# Patient Record
Sex: Female | Born: 1935
Health system: Southern US, Community
[De-identification: ages and names within clinical notes are randomized; demographics above are authoritative.]

## PROBLEM LIST (undated history)

## (undated) DIAGNOSIS — I10 Essential (primary) hypertension: Secondary | ICD-10-CM

## (undated) DIAGNOSIS — F419 Anxiety disorder, unspecified: Secondary | ICD-10-CM

## (undated) HISTORY — PX: ABDOMINAL HYSTERECTOMY: SHX81

## (undated) HISTORY — PX: APPENDECTOMY: SHX54

## (undated) HISTORY — PX: DILATION AND CURETTAGE OF UTERUS: SHX78

---

## 1998-04-02 ENCOUNTER — Other Ambulatory Visit: Admission: RE | Admit: 1998-04-02 | Discharge: 1998-04-02 | Payer: Self-pay | Admitting: Gynecology

## 1999-11-17 ENCOUNTER — Other Ambulatory Visit: Admission: RE | Admit: 1999-11-17 | Discharge: 1999-11-17 | Payer: Self-pay | Admitting: *Deleted

## 2000-08-25 ENCOUNTER — Other Ambulatory Visit: Admission: RE | Admit: 2000-08-25 | Discharge: 2000-08-25 | Payer: Self-pay | Admitting: *Deleted

## 2000-08-25 ENCOUNTER — Encounter (INDEPENDENT_AMBULATORY_CARE_PROVIDER_SITE_OTHER): Payer: Self-pay

## 2001-07-20 ENCOUNTER — Other Ambulatory Visit: Admission: RE | Admit: 2001-07-20 | Discharge: 2001-07-20 | Payer: Self-pay | Admitting: Obstetrics and Gynecology

## 2002-04-08 ENCOUNTER — Other Ambulatory Visit: Admission: RE | Admit: 2002-04-08 | Discharge: 2002-04-08 | Payer: Self-pay | Admitting: Obstetrics and Gynecology

## 2002-04-23 ENCOUNTER — Encounter: Payer: Self-pay | Admitting: Anesthesiology

## 2002-04-25 ENCOUNTER — Inpatient Hospital Stay (HOSPITAL_COMMUNITY): Admission: RE | Admit: 2002-04-25 | Discharge: 2002-04-28 | Payer: Self-pay | Admitting: Obstetrics and Gynecology

## 2003-07-17 ENCOUNTER — Ambulatory Visit (HOSPITAL_COMMUNITY): Admission: RE | Admit: 2003-07-17 | Discharge: 2003-07-17 | Payer: Self-pay | Admitting: Family Medicine

## 2007-05-25 ENCOUNTER — Ambulatory Visit: Payer: Self-pay | Admitting: Internal Medicine

## 2007-06-06 ENCOUNTER — Telehealth (INDEPENDENT_AMBULATORY_CARE_PROVIDER_SITE_OTHER): Payer: Self-pay | Admitting: *Deleted

## 2008-10-13 DIAGNOSIS — I10 Essential (primary) hypertension: Secondary | ICD-10-CM | POA: Insufficient documentation

## 2008-10-13 DIAGNOSIS — E785 Hyperlipidemia, unspecified: Secondary | ICD-10-CM

## 2008-10-14 ENCOUNTER — Ambulatory Visit: Payer: Self-pay | Admitting: Internal Medicine

## 2008-10-14 DIAGNOSIS — I8000 Phlebitis and thrombophlebitis of superficial vessels of unspecified lower extremity: Secondary | ICD-10-CM

## 2008-10-14 DIAGNOSIS — J40 Bronchitis, not specified as acute or chronic: Secondary | ICD-10-CM

## 2008-10-14 LAB — CONVERTED CEMR LAB
Basophils Absolute: 0 10*3/uL (ref 0.0–0.1)
Basophils Relative: 0 % (ref 0.0–3.0)
Eosinophils Absolute: 0.3 10*3/uL (ref 0.0–0.7)
Eosinophils Relative: 4.2 % (ref 0.0–5.0)
HCT: 39.5 % (ref 36.0–46.0)
Hemoglobin: 13.4 g/dL (ref 12.0–15.0)
IgE (Immunoglobulin E), Serum: 140.8 intl units/mL (ref 0.0–180.0)
Lymphocytes Relative: 31.1 % (ref 12.0–46.0)
Lymphs Abs: 2.1 10*3/uL (ref 0.7–4.0)
MCHC: 33.8 g/dL (ref 30.0–36.0)
MCV: 89 fL (ref 78.0–100.0)
Monocytes Absolute: 0.4 10*3/uL (ref 0.1–1.0)
Monocytes Relative: 5.5 % (ref 3.0–12.0)
Neutro Abs: 3.9 10*3/uL (ref 1.4–7.7)
Neutrophils Relative %: 59.2 % (ref 43.0–77.0)
Platelets: 272 10*3/uL (ref 150.0–400.0)
RBC: 4.44 M/uL (ref 3.87–5.11)
RDW: 12.2 % (ref 11.5–14.6)
WBC: 6.7 10*3/uL (ref 4.5–10.5)

## 2008-12-19 ENCOUNTER — Ambulatory Visit: Payer: Self-pay | Admitting: Internal Medicine

## 2008-12-22 ENCOUNTER — Telehealth: Payer: Self-pay | Admitting: Internal Medicine

## 2009-03-20 ENCOUNTER — Ambulatory Visit: Payer: Self-pay | Admitting: Internal Medicine
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
## 2010-11-26 NOTE — Op Note (Signed)
Sandra Reid, Sandra Reid                          ACCOUNT NO.:  [id number]   MEDICAL RECORD NO.:  [id number]                   PATIENT TYPE:   LOCATION:                                       FACILITY:  [facility]   PHYSICIAN:  Tilda Burrow, M.D.              DATE OF BIRTH:  [birth year]   DATE OF PROCEDURE:  DATE OF DISCHARGE:                                 OPERATIVE REPORT   PREOPERATIVE DIAGNOSIS:  Endometrial polyp, postmenopausal adnexal cyst.   POSTOPERATIVE DIAGNOSIS:  Endometrial polyp, postmenopausal adnexal cyst  (left broad ligament cyst).   PROCEDURE:  Total abdominal hysterectomy, bilateral salpingo-oophorectomy.   SURGEON:  Tilda Burrow, M.D.   ASSISTANTAmie Critchley   ANESTHESIA:  General   COMPLICATIONS:  None.   ESTIMATED BLOOD LOSS:  100 cc   INDICATIONS:  This 77 year old female has been followed through our office  for a postmenopausal adnexal cyst.  She was found, on most recent  ultrasound, to have an endometrial polyp as well.  Benign endometrial biopsy  was performed in the office.  The options discussed and the patient selected  proceeding with hysterectomy for definitive answer for all these persisting  GYN questions.   DESCRIPTION OF PROCEDURE:  The patient was taken to the operating room and  prepped and draped for a combined abdominal and vaginal procedure with Foley  catheter in place and abdomen prepped and draped.  A vertical lower  abdominal incision was performed, approximately 12-cm in length.  Easy entry  into the peritoneal cavity was encountered.  The bowel was packed away using  moist and laparotomy tapes, then 4 x 4, then the procedure continued with  elevation of the uterus with Lahey thyroid tenaculum with takedown of the  round ligaments bilaterally using 2 interrupted #0 sutures and transection  with the Bovie between the sutures.  There were some adhesions in the left  adnexa which required sharp dissection of the lateral aspects  of the sigmoid  mesentery from the tube.  There was a simple cyst buried in the left broad  ligament which was clear in appearance, completely covered with peritoneal  surfaces on both sides, and benign in characteristics.  There was no  suspicion of a tumor or malignancy.   The infundibulopelvic ligament could be isolated on the patient's left side  with some difficulty requiring sharp dissection lateral to the IP ligaments  up to its level of the internal iliac artery whereupon we could identify the  ureter deep on the lateral pelvic sidewall.  The IFP ligament could be  identified separately, cross clamped and transected.  We were able to  dissect the remaining attachments from the ovary to the sigmoid at this  time.   We then were able to elevate the broad ligament and cross clamp the uterine  vessels with the curved Heaney clamp and Kelly clamp for back bleeding and  #  0 chromic suture ligature was followed.   The right side was treated with a much easier process of identifying the  infundibulopelvic ligament and cross clamping it and remaining high away  from the ureter.  On this side, the uterine vessels were easily cross  clamped, transected and suture ligated.  The bladder flap had been developed  on the anterior uterus.  We proceeded down the upper and lower Cardinal  ligaments at this time with serial bites with straight Heaney clamps, knife  dissection, and #0 chromic suture ligature.  The patient then proceeded to  allow dissection beneath the bladder flap sufficiently that we could then  cross clamp at the vaginal cuff angles with curved Heaney clamps, enter the  cuff, and amputate the cervix off the vaginal cuff with a good smooth  surgical edge.  Interrupted sutures, figure-of-eight #0 chromic completed  closure of the cuff.  Irrigation confirmed hemostasis.  Peritoneum did not  require any suturing to have adequate reapproximation.   The pelvis was irrigated copiously,  dried, inspected, and found hemostatic.  Then the anterior peritoneum closed with #0 chromic, the fascia closed with  #0 Vicryl and the subcu fatty tissue interrupted with continuous running 2-0  plain followed by staple closure of the skin.  The patient tolerated the  procedure well, went to the recovery room in good condition.   ESTIMATED BLOOD LOSS:  100 cc                                               Tilda Burrow, M.D.    JVF/MEDQ  D:  04/25/2002  T:  04/25/2002  Job:  [id number]   cc:   [facility]
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
## 2010-11-26 NOTE — H&P (Signed)
Sandra Reid, KLEPACKI                          ACCOUNT NO.:  [id number]   MEDICAL RECORD NO.:  [id number]                   PATIENT TYPE:   LOCATION:                                       FACILITY:  [facility]   PHYSICIAN:  Tilda Burrow, M.D.              DATE OF BIRTH:  30-Jan-1931   DATE OF ADMISSION:  04/25/2002  DATE OF DISCHARGE:                                HISTORY & PHYSICAL   ADMITTING DIAGNOSES:  1. Postmenopausal endometrial polyp.  2. Postmenopausal adnexal cyst, stable.   HISTORY OF PRESENT ILLNESS:  This 77 year old female is admitted for  abdominal hysterectomy and bilateral salpingo-oophorectomy for resolution of  two specific problems.  Firstly, the patient has been followed through our  office for three years for a left adnexal cyst.  Our first review was after  an ultrasound which showed a 2.3-cm left adnexal cyst felt to represent a  simple cyst.  It could represent either ovarian or para-ovarian origin.  CA125 was within normal limits.  She had been followed for a period of a  year at another office before coming to [place].  Since coming to our office,  CA125 levels have been acceptable and within normal limits; that level was  8.0 on a scale of 0 to 30 in January of this year.  When followed up in mid-  year, the first vaginal ultrasound in our office confirmed again that this  cyst was approximately 2.9 to 3.1 cm in size and appeared to be a clear  simple cystic structure without internal solid components, excrescences or  multiple septations.  The uterus unfortunately showed the endometrium to  have a polypoid structure which on endometrial biopsy was benign.  Hysteroscopy and D&C at a minimum were indicated but after discussion of the  options, the patient wishes no longer to have to deal with the concern and  the anxiety of serial monitoring of the postmenopausal adnexal cyst.  It is  her belief that given the fact that the surgical procedure arranged for is  demanded, that a hysterectomy with removal of both tubes and ovaries is most  effective way to manage her GYN concerns; this makes excellent sense and is  fully supported.  The patient is aware that surgical risks such as injury to  adjacent organs cannot be avoided with full surgery of this sort.  Additionally, the patient knows that there is a slight possibility that the  adnexal tumor could be found to have a malignant or borderline component.  Further resection is planned to guide subsequent therapy.   PAST MEDICAL HISTORY:  Essential hypertension.   SURGICAL HISTORY:  D&C, tubal ligation, appendectomy in the late 1970s.   ALLERGIES:  TERRAMYCIN and PREDNISONE.   MEDICATIONS:  1. Antihypertensive and diuretic, [facility], as prescribed by Dr.     Kirk Ruths of [facility].  2. Xanax one-half of a  0.5 mg tablet p.r.n. at bedtime.   PHYSICAL EXAMINATION:  GENERAL:  General exam shows a healthy, slim  Caucasian female, alert and oriented x3.  Height 5 feet 4 inches.  Weight  149.  VITAL SIGNS:  Blood pressure 160/75.  HEENT:  Pupils are equal, round and reactive.  NECK:  Neck supple.  Trachea midline.  CHEST:  Chest clear to auscultation.  ABDOMEN:  Slim with well-healed midline transverse incision in the  suprapubic area from her appendectomy and tubal.  PELVIC:  External genitalia normal.  Vaginal exam:  Normal secretion and  cervix.  Pap smear class I, January 2003.  Adnexa positive for the  ultrasound-found 3-cm left cystic structure.  Uterus anteflexed.   PLAN:  Abdominal hysterectomy and bilateral salpingo-oophorectomy with  frozen section on April 25, 2002, labs April 23, 2002.                                               Tilda Burrow, M.D.    JVF/MEDQ  D:  04/14/2002  T:  04/15/2002  Job:  [id number]

## 2010-11-26 NOTE — Procedures (Signed)
   Sandra Reid, Sandra Reid                            ACCOUNT NO.:  [id number]   MEDICAL RECORD NO.:  [id number]                    PATIENT TYPE:   LOCATION:                                       FACILITY:   PHYSICIAN:  Edward L. Juanetta Gosling, M.D.             DATE OF BIRTH:   DATE OF PROCEDURE:  04/23/2002  DATE OF DISCHARGE:                              PULMONARY FUNCTION TEST   1. Spirometry does not show any evidence of a ventilatory defect but there     is some airflow obstruction at the level of the small airways.  2. Lung volumes show no evidence of restrictive change but there is minimal     air trapping.  3. DLCO is normal.  4. Arterial blood gases show mild relative resting hypoxemia.                                                Edward L. Juanetta Gosling, M.D.    ELH/MEDQ  D:  04/23/2002  T:  04/24/2002  Job:  [id number]

## 2010-11-29 ENCOUNTER — Other Ambulatory Visit (HOSPITAL_COMMUNITY): Payer: Self-pay | Admitting: Family Medicine

## 2010-11-29 DIAGNOSIS — Z0000 Encounter for general adult medical examination without abnormal findings: Secondary | ICD-10-CM

## 2010-12-03 ENCOUNTER — Other Ambulatory Visit (HOSPITAL_COMMUNITY): Payer: Self-pay | Admitting: Family Medicine

## 2010-12-03 ENCOUNTER — Ambulatory Visit (HOSPITAL_COMMUNITY)
Admission: RE | Admit: 2010-12-03 | Discharge: 2010-12-03 | Disposition: A | Discharge status: Home / Self Care | Payer: Medicare Other | Source: Ambulatory Visit | Attending: Family Medicine | Admitting: Family Medicine

## 2010-12-03 DIAGNOSIS — R05 Cough: Secondary | ICD-10-CM | POA: Insufficient documentation

## 2010-12-03 DIAGNOSIS — R059 Cough, unspecified: Secondary | ICD-10-CM | POA: Insufficient documentation

## 2010-12-03 DIAGNOSIS — R0602 Shortness of breath: Secondary | ICD-10-CM | POA: Insufficient documentation

## 2010-12-08 ENCOUNTER — Other Ambulatory Visit (HOSPITAL_COMMUNITY): Payer: Self-pay

## 2010-12-21 ENCOUNTER — Other Ambulatory Visit (HOSPITAL_COMMUNITY): Payer: Self-pay

## 2011-09-28 DIAGNOSIS — B351 Tinea unguium: Secondary | ICD-10-CM | POA: Diagnosis not present

## 2011-10-26 ENCOUNTER — Other Ambulatory Visit (HOSPITAL_COMMUNITY): Payer: Self-pay | Admitting: Physician Assistant

## 2011-10-26 DIAGNOSIS — J45909 Unspecified asthma, uncomplicated: Secondary | ICD-10-CM | POA: Diagnosis not present

## 2011-10-26 DIAGNOSIS — I10 Essential (primary) hypertension: Secondary | ICD-10-CM | POA: Diagnosis not present

## 2011-10-26 DIAGNOSIS — F411 Generalized anxiety disorder: Secondary | ICD-10-CM | POA: Diagnosis not present

## 2011-10-26 DIAGNOSIS — Z139 Encounter for screening, unspecified: Secondary | ICD-10-CM

## 2011-10-27 ENCOUNTER — Other Ambulatory Visit (HOSPITAL_COMMUNITY): Payer: Medicare Other

## 2011-10-27 ENCOUNTER — Ambulatory Visit (HOSPITAL_COMMUNITY)
Admission: RE | Admit: 2011-10-27 | Discharge: 2011-10-27 | Disposition: A | Discharge status: Home / Self Care | Payer: Medicare Other | Source: Ambulatory Visit | Attending: Physician Assistant | Admitting: Physician Assistant

## 2011-10-27 DIAGNOSIS — Z13820 Encounter for screening for osteoporosis: Secondary | ICD-10-CM | POA: Diagnosis not present

## 2011-10-27 DIAGNOSIS — Z139 Encounter for screening, unspecified: Secondary | ICD-10-CM

## 2011-10-27 DIAGNOSIS — Z0000 Encounter for general adult medical examination without abnormal findings: Secondary | ICD-10-CM | POA: Diagnosis not present

## 2011-10-27 DIAGNOSIS — F411 Generalized anxiety disorder: Secondary | ICD-10-CM | POA: Diagnosis not present

## 2011-10-27 DIAGNOSIS — J45909 Unspecified asthma, uncomplicated: Secondary | ICD-10-CM | POA: Diagnosis not present

## 2011-10-27 DIAGNOSIS — M810 Age-related osteoporosis without current pathological fracture: Secondary | ICD-10-CM | POA: Insufficient documentation

## 2011-10-27 DIAGNOSIS — I10 Essential (primary) hypertension: Secondary | ICD-10-CM | POA: Diagnosis not present

## 2011-10-27 DIAGNOSIS — Z780 Asymptomatic menopausal state: Secondary | ICD-10-CM | POA: Diagnosis not present

## 2011-10-27 DIAGNOSIS — Z9079 Acquired absence of other genital organ(s): Secondary | ICD-10-CM | POA: Diagnosis not present

## 2011-10-28 ENCOUNTER — Ambulatory Visit (HOSPITAL_COMMUNITY): Payer: Medicare Other

## 2011-12-21 DIAGNOSIS — B351 Tinea unguium: Secondary | ICD-10-CM | POA: Diagnosis not present

## 2012-01-10 DIAGNOSIS — H52229 Regular astigmatism, unspecified eye: Secondary | ICD-10-CM | POA: Diagnosis not present

## 2012-01-10 DIAGNOSIS — H2589 Other age-related cataract: Secondary | ICD-10-CM | POA: Diagnosis not present

## 2012-01-10 DIAGNOSIS — H524 Presbyopia: Secondary | ICD-10-CM | POA: Diagnosis not present

## 2012-01-10 DIAGNOSIS — H5210 Myopia, unspecified eye: Secondary | ICD-10-CM | POA: Diagnosis not present

## 2012-02-01 DIAGNOSIS — B351 Tinea unguium: Secondary | ICD-10-CM | POA: Diagnosis not present

## 2012-03-05 DIAGNOSIS — H547 Unspecified visual loss: Secondary | ICD-10-CM | POA: Diagnosis not present

## 2012-03-05 DIAGNOSIS — H2589 Other age-related cataract: Secondary | ICD-10-CM | POA: Diagnosis not present

## 2012-03-06 ENCOUNTER — Encounter (HOSPITAL_COMMUNITY): Payer: Self-pay | Admitting: Pharmacy Technician

## 2012-03-09 ENCOUNTER — Other Ambulatory Visit: Payer: Self-pay

## 2012-03-09 ENCOUNTER — Encounter (HOSPITAL_COMMUNITY)
Admission: RE | Admit: 2012-03-09 | Discharge: 2012-03-09 | Disposition: A | Discharge status: Home / Self Care | Payer: Medicare Other | Source: Ambulatory Visit | Attending: Ophthalmology | Admitting: Ophthalmology

## 2012-03-09 ENCOUNTER — Encounter (HOSPITAL_COMMUNITY): Payer: Self-pay

## 2012-03-09 DIAGNOSIS — H2589 Other age-related cataract: Secondary | ICD-10-CM | POA: Diagnosis not present

## 2012-03-09 DIAGNOSIS — Z01810 Encounter for preprocedural cardiovascular examination: Secondary | ICD-10-CM | POA: Diagnosis not present

## 2012-03-09 DIAGNOSIS — I10 Essential (primary) hypertension: Secondary | ICD-10-CM | POA: Diagnosis not present

## 2012-03-09 DIAGNOSIS — Z01812 Encounter for preprocedural laboratory examination: Secondary | ICD-10-CM | POA: Diagnosis not present

## 2012-03-09 HISTORY — DX: Anxiety disorder, unspecified: F41.9

## 2012-03-09 HISTORY — DX: Essential (primary) hypertension: I10

## 2012-03-09 LAB — BASIC METABOLIC PANEL
BUN: 10 mg/dL (ref 6–23)
Chloride: 99 mEq/L (ref 96–112)
GFR calc Af Amer: 68 mL/min — ABNORMAL LOW (ref >90)
Glucose, Bld: 101 mg/dL — ABNORMAL HIGH (ref 70–99)
Potassium: 4.3 mEq/L (ref 3.5–5.1)

## 2012-03-09 NOTE — Patient Instructions (Addendum)
Your procedure is scheduled on: 03/15/2012  Report to [facility] at   1130      AM.  Call this number if you have problems the morning of surgery: [phone]   Do not eat food or drink liquids :After Midnight.      Take these medicines the morning of surgery with A SIP OF WATER:xanax,diovan,singulair   Do not wear jewelry, make-up or nail polish.  Do not wear lotions, powders, or perfumes. You may wear deodorant.  Do not shave 48 hours prior to surgery.  Do not bring valuables to the hospital.  Contacts, dentures or bridgework may not be worn into surgery.  Leave suitcase in the car. After surgery it may be brought to your room.  For patients admitted to the hospital, checkout time is 11:00 AM the day of discharge.   Patients discharged the day of surgery will not be allowed to drive home.  :     Please read over the following fact sheets that you were given: Coughing and Deep Breathing, Surgical Site Infection Prevention, Anesthesia Post-op Instructions and Care and Recovery After Surgery    Cataract A cataract is a clouding of the lens of the eye. When a lens becomes cloudy, vision is reduced based on the degree and nature of the clouding. Many cataracts reduce vision to some degree. Some cataracts make people more near-sighted as they develop. Other cataracts increase glare. Cataracts that are ignored and become worse can sometimes look white. The white color can be seen through the pupil. CAUSES   Aging. However, cataracts may occur at any age, even in newborns.   Certain drugs.   Trauma to the eye.   Certain diseases such as diabetes.   Specific eye diseases such as chronic inflammation inside the eye or a sudden attack of a rare form of glaucoma.   Inherited or acquired medical problems.  SYMPTOMS   Gradual, progressive drop in vision in the affected eye.   Severe, rapid visual loss. This most often happens when trauma is the cause.  DIAGNOSIS  To detect a cataract, an  eye doctor examines the lens. Cataracts are best diagnosed with an exam of the eyes with the pupils enlarged (dilated) by drops.  TREATMENT  For an early cataract, vision may improve by using different eyeglasses or stronger lighting. If that does not help your vision, surgery is the only effective treatment. A cataract needs to be surgically removed when vision loss interferes with your everyday activities, such as driving, reading, or watching TV. A cataract may also have to be removed if it prevents examination or treatment of another eye problem. Surgery removes the cloudy lens and usually replaces it with a substitute lens (intraocular lens, IOL).  At a time when both you and your doctor agree, the cataract will be surgically removed. If you have cataracts in both eyes, only one is usually removed at a time. This allows the operated eye to heal and be out of danger from any possible problems after surgery (such as infection or poor wound healing). In rare cases, a cataract may be doing damage to your eye. In these cases, your caregiver may advise surgical removal right away. The vast majority of people who have cataract surgery have better vision afterward. HOME CARE INSTRUCTIONS  If you are not planning surgery, you may be asked to do the following:  Use different eyeglasses.   Use stronger or brighter lighting.   Ask your eye doctor about reducing  your medicine dose or changing medicines if it is thought that a medicine caused your cataract. Changing medicines does not make the cataract go away on its own.   Become familiar with your surroundings. Poor vision can lead to injury. Avoid bumping into things on the affected side. You are at a higher risk for tripping or falling.   Exercise extreme care when driving or operating machinery.   Wear sunglasses if you are sensitive to bright light or experiencing problems with glare.  SEEK IMMEDIATE MEDICAL CARE IF:   You have a worsening or sudden  vision loss.   You notice redness, swelling, or increasing pain in the eye.   You have a fever.  Document Released: 06/27/2005 Document Revised: 06/16/2011 Document Reviewed: 02/18/2011 [facility] Patient Information 2012 [place], [place].PATIENT INSTRUCTIONS POST-ANESTHESIA  IMMEDIATELY FOLLOWING SURGERY:  Do not drive or operate machinery for the first twenty four hours after surgery.  Do not make any important decisions for twenty four hours after surgery or while taking narcotic pain medications or sedatives.  If you develop intractable nausea and vomiting or a severe headache please notify your doctor immediately.  FOLLOW-UP:  Please make an appointment with your surgeon as instructed. You do not need to follow up with anesthesia unless specifically instructed to do so.  WOUND CARE INSTRUCTIONS (if applicable):  Keep a dry clean dressing on the anesthesia/puncture wound site if there is drainage.  Once the wound has quit draining you may leave it open to air.  Generally you should leave the bandage intact for twenty four hours unless there is drainage.  If the epidural site drains for more than 36-48 hours please call the anesthesia department.  QUESTIONS?:  Please feel free to call your physician or the hospital operator if you have any questions, and they will be happy to assist you.

## 2012-03-15 ENCOUNTER — Ambulatory Visit (HOSPITAL_COMMUNITY): Payer: Medicare Other | Admitting: Anesthesiology

## 2012-03-15 ENCOUNTER — Encounter (HOSPITAL_COMMUNITY)
Admission: RE | Disposition: A | Discharge status: Home / Self Care | Payer: Self-pay | Source: Ambulatory Visit | Attending: Ophthalmology

## 2012-03-15 ENCOUNTER — Encounter (HOSPITAL_COMMUNITY): Payer: Self-pay | Admitting: Anesthesiology

## 2012-03-15 ENCOUNTER — Encounter (HOSPITAL_COMMUNITY): Payer: Self-pay | Admitting: *Deleted

## 2012-03-15 ENCOUNTER — Ambulatory Visit (HOSPITAL_COMMUNITY)
Admission: RE | Admit: 2012-03-15 | Discharge: 2012-03-15 | Disposition: A | Discharge status: Home / Self Care | Payer: Medicare Other | Source: Ambulatory Visit | Attending: Ophthalmology | Admitting: Ophthalmology

## 2012-03-15 ENCOUNTER — Encounter (HOSPITAL_COMMUNITY): Payer: Self-pay | Admitting: Ophthalmology

## 2012-03-15 DIAGNOSIS — Z01810 Encounter for preprocedural cardiovascular examination: Secondary | ICD-10-CM | POA: Diagnosis not present

## 2012-03-15 DIAGNOSIS — H2589 Other age-related cataract: Secondary | ICD-10-CM | POA: Insufficient documentation

## 2012-03-15 DIAGNOSIS — I10 Essential (primary) hypertension: Secondary | ICD-10-CM | POA: Insufficient documentation

## 2012-03-15 DIAGNOSIS — Z01812 Encounter for preprocedural laboratory examination: Secondary | ICD-10-CM | POA: Diagnosis not present

## 2012-03-15 DIAGNOSIS — H269 Unspecified cataract: Secondary | ICD-10-CM | POA: Diagnosis not present

## 2012-03-15 HISTORY — PX: CATARACT EXTRACTION W/PHACO: SHX586

## 2012-03-15 SURGERY — PHACOEMULSIFICATION, CATARACT, WITH IOL INSERTION
Anesthesia: Monitor Anesthesia Care | Site: Eye | Laterality: Left | Wound class: Clean

## 2012-03-15 MED ORDER — PHENYLEPHRINE HCL 2.5 % OP SOLN
1.0000 [drp] | OPHTHALMIC | Status: AC
  Administered 2012-03-15 (×3): 1 [drp] via OPHTHALMIC

## 2012-03-15 MED ORDER — LIDOCAINE HCL 3.5 % OP GEL
1.0000 "application " | Freq: Once | OPHTHALMIC | Status: AC
  Administered 2012-03-15: 1 via OPHTHALMIC

## 2012-03-15 MED ORDER — LIDOCAINE 3.5 % OP GEL OPTIME - NO CHARGE
OPHTHALMIC | Status: DC | PRN
  Administered 2012-03-15: 2 [drp] via OPHTHALMIC

## 2012-03-15 MED ORDER — NEOMYCIN-POLYMYXIN-DEXAMETH 3.5-10000-0.1 OP OINT
TOPICAL_OINTMENT | OPHTHALMIC | Status: AC
  Filled 2012-03-15: qty 3.5

## 2012-03-15 MED ORDER — LIDOCAINE HCL 3.5 % OP GEL
OPHTHALMIC | Status: AC
  Filled 2012-03-15: qty 5

## 2012-03-15 MED ORDER — PROVISC 10 MG/ML IO SOLN
INTRAOCULAR | Status: DC | PRN
  Administered 2012-03-15: 8.5 mg via INTRAOCULAR

## 2012-03-15 MED ORDER — BSS IO SOLN
INTRAOCULAR | Status: DC | PRN
  Administered 2012-03-15: 15 mL via INTRAOCULAR

## 2012-03-15 MED ORDER — POVIDONE-IODINE 5 % OP SOLN
OPHTHALMIC | Status: DC | PRN
  Administered 2012-03-15: 1 via OPHTHALMIC

## 2012-03-15 MED ORDER — CYCLOPENTOLATE HCL 1 % OP SOLN
OPHTHALMIC | Status: AC
  Filled 2012-03-15: qty 2

## 2012-03-15 MED ORDER — LIDOCAINE HCL (PF) 1 % IJ SOLN
INTRAMUSCULAR | Status: AC
  Filled 2012-03-15: qty 2

## 2012-03-15 MED ORDER — NEOMYCIN-POLYMYXIN-DEXAMETH 0.1 % OP OINT
TOPICAL_OINTMENT | OPHTHALMIC | Status: DC | PRN
  Administered 2012-03-15: 1 via OPHTHALMIC

## 2012-03-15 MED ORDER — LIDOCAINE HCL (PF) 1 % IJ SOLN
INTRAMUSCULAR | Status: DC | PRN
  Administered 2012-03-15: .6 mL

## 2012-03-15 MED ORDER — TETRACAINE HCL 0.5 % OP SOLN
1.0000 [drp] | OPHTHALMIC | Status: AC
  Administered 2012-03-15 (×3): 1 [drp] via OPHTHALMIC

## 2012-03-15 MED ORDER — MIDAZOLAM HCL 2 MG/2ML IJ SOLN
1.0000 mg | INTRAMUSCULAR | Status: DC | PRN
  Administered 2012-03-15 (×2): 1 mg via INTRAVENOUS

## 2012-03-15 MED ORDER — EPINEPHRINE HCL 1 MG/ML IJ SOLN
INTRAOCULAR | Status: DC | PRN
  Administered 2012-03-15: 13:00:00

## 2012-03-15 MED ORDER — TETRACAINE HCL 0.5 % OP SOLN
OPHTHALMIC | Status: AC
  Filled 2012-03-15: qty 2

## 2012-03-15 MED ORDER — MIDAZOLAM HCL 2 MG/2ML IJ SOLN
INTRAMUSCULAR | Status: AC
  Filled 2012-03-15: qty 2

## 2012-03-15 MED ORDER — LACTATED RINGERS IV SOLN
INTRAVENOUS | Status: DC
  Administered 2012-03-15: 12:00:00 via INTRAVENOUS

## 2012-03-15 MED ORDER — CYCLOPENTOLATE HCL 1 % OP SOLN
1.0000 [drp] | OPHTHALMIC | Status: AC
  Administered 2012-03-15 (×3): 1 [drp] via OPHTHALMIC

## 2012-03-15 MED ORDER — PHENYLEPHRINE HCL 2.5 % OP SOLN
OPHTHALMIC | Status: AC
  Filled 2012-03-15: qty 2

## 2012-03-15 MED ORDER — STERILE WATER FOR IRRIGATION IR SOLN
Status: DC | PRN
  Administered 2012-03-15: 25 mL

## 2012-03-15 MED ORDER — CYCLOPENTOLATE-PHENYLEPHRINE 0.2-1 % OP SOLN: 1.0000 [drp] | OPHTHALMIC | Status: AC

## 2012-03-15 MED ORDER — EPINEPHRINE HCL 1 MG/ML IJ SOLN
INTRAMUSCULAR | Status: AC
  Filled 2012-03-15: qty 1

## 2012-03-15 SURGICAL SUPPLY — 32 items

## 2012-03-15 NOTE — H&P (Signed)
I have reviewed the H&P, the patient was re-examined, and I have identified no interval changes in medical condition and plan of care since the history and physical of record  

## 2012-03-15 NOTE — Anesthesia Postprocedure Evaluation (Signed)
  Anesthesia Post-op Note  Patient: Sandra Reid  Procedure(s) Performed: Procedure(s) (LRB) with comments: CATARACT EXTRACTION PHACO AND INTRAOCULAR LENS PLACEMENT (IOC) (Left) - CDE:  14.84  Patient Location: PACU  Anesthesia Type: MAC  Level of Consciousness: awake  Airway and Oxygen Therapy: Patient Spontanous Breathing  Post-op Pain: none  Post-op Assessment: Post-op Vital signs reviewed, Patient's Cardiovascular Status Stable, Respiratory Function Stable, Patent Airway and No signs of Nausea or vomiting  Post-op Vital Signs: Reviewed and stable  Complications: No apparent anesthesia complications

## 2012-03-15 NOTE — Transfer of Care (Signed)
Immediate Anesthesia Transfer of Care Note  Patient: Sandra Reid  Procedure(s) Performed: Procedure(s) (LRB) with comments: CATARACT EXTRACTION PHACO AND INTRAOCULAR LENS PLACEMENT (IOC) (Left) - CDE:  14.84  Patient Location: PACU and Short Stay  Anesthesia Type: MAC  Level of Consciousness: awake, alert  and oriented  Airway & Oxygen Therapy: Patient Spontanous Breathing  Post-op Assessment: Report given to PACU RN  Post vital signs: Reviewed and stable  Complications: No apparent anesthesia complications

## 2012-03-15 NOTE — Anesthesia Preprocedure Evaluation (Addendum)
Anesthesia Evaluation  Patient identified by MRN, date of birth, ID band Patient awake    Reviewed: Allergy & Precautions, H&P , NPO status , Patient's Chart, lab work & pertinent test results  Airway Mallampati: II      Dental  (+) Upper Dentures and Edentulous Lower   Pulmonary  Allergies  breath sounds clear to auscultation        Cardiovascular hypertension, Pt. on medications Rhythm:Regular     Neuro/Psych Anxiety    GI/Hepatic   Endo/Other    Renal/GU      Musculoskeletal   Abdominal   Peds  Hematology   Anesthesia Other Findings   Reproductive/Obstetrics                           Anesthesia Physical Anesthesia Plan  ASA: II  Anesthesia Plan: MAC   Post-op Pain Management:    Induction: Intravenous  Airway Management Planned: Nasal Cannula  Additional Equipment:   Intra-op Plan:   Post-operative Plan:   Informed Consent: I have reviewed the patients History and Physical, chart, labs and discussed the procedure including the risks, benefits and alternatives for the proposed anesthesia with the patient or authorized representative who has indicated his/her understanding and acceptance.     Plan Discussed with:   Anesthesia Plan Comments:         Anesthesia Quick Evaluation  

## 2012-03-15 NOTE — Brief Op Note (Signed)
Pre-Op Dx: Cataract OS Post-Op Dx: Cataract OS Surgeon: Caterina Racine Anesthesia: Topical with MAC Surgery: Cataract Extraction with Intraocular lens Implant OS Implant: B&L enVista Specimen: None Complications: None 

## 2012-03-16 NOTE — Op Note (Signed)
Sandra Reid, Sandra Reid                ACCOUNT NO.:  [id number]  MEDICAL RECORD NO.:  [id number]  LOCATION:  [facility]                          FACILITY:  [facility]  PHYSICIAN:  Susanne Greenhouse, MD       DATE OF BIRTH:  October 26, 1930  DATE OF PROCEDURE:  03/15/2012 DATE OF DISCHARGE:  03/15/2012                              OPERATIVE REPORT   PREOPERATIVE DIAGNOSIS:  Combined cataract, left eye, diagnosis code 366.19.  POSTOPERATIVE DIAGNOSIS:  Combined cataract, left eye, diagnosis code 366.19.  OPERATION PERFORMED:  Phacoemulsification with posterior chamber intraocular lens implantation, left eye.  SURGEON:  Susanne Greenhouse, MD.  ANESTHESIA:  Topical with IV sedation..  OPERATIVE SUMMARY:  In the preoperative area, dilating drops were placed into the left eye.  The patient was then brought into the operating room where she was placed under general anesthesia.  The eye was then prepped and draped.  Beginning with a 75 blade, a paracentesis port was made at the surgeon's 2 o'clock position.  The anterior chamber was then filled with a 1% nonpreserved lidocaine solution with epinephrine.  This was followed by Viscoat to deepen the chamber.  A small fornix-based peritomy was performed superiorly.  Next, a single iris hook was placed through the limbus superiorly.  A 2.4-mm keratome blade was then used to make a clear corneal incision over the iris hook.  A bent cystotome needle and Utrata forceps were used to create a continuous tear capsulotomy.  Hydrodissection was performed using balanced salt solution on a fine cannula.  The lens nucleus was then removed using phacoemulsification in a quadrant cracking technique.  The cortical material was then removed with irrigation and aspiration.  The capsular bag and anterior chamber were refilled with Provisc.  The wound was widened to approximately 3 mm and a posterior chamber intraocular lens was placed into the capsular bag without difficulty using an  [employer] lens injecting system.  A single 10-0 nylon suture was then used to close the incision as well as stromal hydration.  The Provisc was removed from the anterior chamber and capsular bag with irrigation and aspiration.  At this point, the wounds were tested for leak, which were negative.  The anterior chamber remained deep and stable.  The patient tolerated the procedure well.  There were no operative complications, and she awoke from general anesthesia without problem.  No surgical specimens.  Prosthetic device used is a [employer] enVista posterior chamber lens model MX60, power of 20.5, serial number is [id number].          ______________________________ Susanne Greenhouse, MD     KEH/MEDQ  D:  03/15/2012  T:  03/16/2012  Job:  [id number]

## 2012-03-19 ENCOUNTER — Encounter (HOSPITAL_COMMUNITY): Payer: Self-pay | Admitting: Ophthalmology

## 2012-03-21 DIAGNOSIS — D239 Other benign neoplasm of skin, unspecified: Secondary | ICD-10-CM | POA: Diagnosis not present

## 2012-05-01 DIAGNOSIS — H04129 Dry eye syndrome of unspecified lacrimal gland: Secondary | ICD-10-CM | POA: Diagnosis not present

## 2012-05-02 DIAGNOSIS — B351 Tinea unguium: Secondary | ICD-10-CM | POA: Diagnosis not present

## 2012-05-03 DIAGNOSIS — H04129 Dry eye syndrome of unspecified lacrimal gland: Secondary | ICD-10-CM | POA: Diagnosis not present

## 2012-05-10 DIAGNOSIS — H018 Other specified inflammations of eyelid: Secondary | ICD-10-CM | POA: Diagnosis not present

## 2012-05-10 DIAGNOSIS — H2589 Other age-related cataract: Secondary | ICD-10-CM | POA: Diagnosis not present

## 2012-05-11 ENCOUNTER — Encounter (HOSPITAL_COMMUNITY): Payer: Self-pay | Admitting: Pharmacy Technician

## 2012-05-11 NOTE — Patient Instructions (Signed)
20 Sandra Reid  05/11/2012   Your procedure is scheduled on: 05/17/12  Report to [facility] at 1240 AM.  Call this number if you have problems the morning of surgery: [phone]   Remember:   Do not eat food:After Midnight.  May have clear liquids:until Midnight .  Clear liquids include soda, tea, black coffee, apple or grape juice, broth.  Take these medicines the morning of surgery with A SIP OF WATER: xanax, singulair, diovan   Do not wear jewelry, make-up or nail polish.  Do not wear lotions, powders, or perfumes. You may wear deodorant.  Do not shave 48 hours prior to surgery. Men may shave face and neck.  Do not bring valuables to the hospital.  Contacts, dentures or bridgework may not be worn into surgery.  Leave suitcase in the car. After surgery it may be brought to your room.  For patients admitted to the hospital, checkout time is 11:00 AM the day of discharge.   Patients discharged the day of surgery will not be allowed to drive home.  Name and phone number of your driver: family  Special Instructions: N/A   Please read over the following fact sheets that you were given: Anesthesia Post-op Instructions and Care and Recovery After Surgery   PATIENT INSTRUCTIONS POST-ANESTHESIA  IMMEDIATELY FOLLOWING SURGERY:  Do not drive or operate machinery for the first twenty four hours after surgery.  Do not make any important decisions for twenty four hours after surgery or while taking narcotic pain medications or sedatives.  If you develop intractable nausea and vomiting or a severe headache please notify your doctor immediately.  FOLLOW-UP:  Please make an appointment with your surgeon as instructed. You do not need to follow up with anesthesia unless specifically instructed to do so.  WOUND CARE INSTRUCTIONS (if applicable):  Keep a dry clean dressing on the anesthesia/puncture wound site if there is drainage.  Once the wound has quit draining you may leave it open to air.   Generally you should leave the bandage intact for twenty four hours unless there is drainage.  If the epidural site drains for more than 36-48 hours please call the anesthesia department.  QUESTIONS?:  Please feel free to call your physician or the hospital operator if you have any questions, and they will be happy to assist you.      Cataract Surgery  A cataract is a clouding of the lens of the eye. When a lens becomes cloudy, vision is reduced based on the degree and nature of the clouding. Surgery may be needed to improve vision. Surgery removes the cloudy lens and usually replaces it with a substitute lens (intraocular lens, IOL). LET YOUR EYE DOCTOR KNOW ABOUT:  Allergies to food or medicine.  Medicines taken including herbs, eyedrops, over-the-counter medicines, and creams.  Use of steroids (by mouth or creams).  Previous problems with anesthetics or numbing medicine.  History of bleeding problems or blood clots.  Previous surgery.  Other health problems, including diabetes and kidney problems.  Possibility of pregnancy, if this applies. RISKS AND COMPLICATIONS  Infection.  Inflammation of the eyeball (endophthalmitis) that can spread to both eyes (sympathetic ophthalmia).  Poor wound healing.  If an IOL is inserted, it can later fall out of proper position. This is very uncommon.  Clouding of the part of your eye that holds an IOL in place. This is called an "after-cataract." These are uncommon, but easily treated. BEFORE THE PROCEDURE  Do not eat or  drink anything except small amounts of water for 8 to 12 before your surgery, or as directed by your caregiver.  Unless you are told otherwise, continue any eyedrops you have been prescribed.  Talk to your primary caregiver about all other medicines that you take (both prescription and non-prescription). In some cases, you may need to stop or change medicines near the time of your surgery. This is most important if you  are taking blood-thinning medicine.Do not stop medicines unless you are told to do so.  Arrange for someone to drive you to and from the procedure.  Do not put contact lenses in either eye on the day of your surgery. PROCEDURE There is more than one method for safely removing a cataract. Your doctor can explain the differences and help determine which is best for you. Phacoemulsification surgery is the most common form of cataract surgery.  An injection is given behind the eye or eyedrops are given to make this a painless procedure.  A small cut (incision) is made on the edge of the clear, dome-shaped surface that covers the front of the eye (cornea).  A tiny probe is painlessly inserted into the eye. This device gives off ultrasound waves that soften and break up the cloudy center of the lens. This makes it easier for the cloudy lens to be removed by suction.  An IOL may be implanted.  The normal lens of the eye is covered by a clear capsule. Part of that capsule is intentionally left in the eye to support the IOL.  Your surgeon may or may not use stitches to close the incision. There are other forms of cataract surgery that require a larger incision and stiches to close the eye. This approach is taken in cases where the doctor feels that the cataract cannot be easily removed using phacoemulsification. AFTER THE PROCEDURE  When an IOL is implanted, it does not need care. It becomes a permanent part of your eye and cannot be seen or felt.  Your doctor will schedule follow-up exams to check on your progress.  Review your other medicines with your doctor to see which can be resumed after surgery.  Use eyedrops or take medicine as prescribed by your doctor. Document Released: 06/16/2011 Document Revised: 09/19/2011 Document Reviewed: 06/16/2011 [facility] Patient Information 2013 [place], [place].

## 2012-05-14 ENCOUNTER — Encounter (HOSPITAL_COMMUNITY)
Admission: RE | Admit: 2012-05-14 | Discharge: 2012-05-14 | Disposition: A | Discharge status: Home / Self Care | Payer: Medicare Other | Source: Ambulatory Visit | Attending: Ophthalmology | Admitting: Ophthalmology

## 2012-05-14 ENCOUNTER — Encounter (HOSPITAL_COMMUNITY): Payer: Self-pay

## 2012-05-14 DIAGNOSIS — Z01812 Encounter for preprocedural laboratory examination: Secondary | ICD-10-CM | POA: Diagnosis not present

## 2012-05-14 DIAGNOSIS — H2589 Other age-related cataract: Secondary | ICD-10-CM | POA: Diagnosis not present

## 2012-05-14 DIAGNOSIS — I10 Essential (primary) hypertension: Secondary | ICD-10-CM | POA: Diagnosis not present

## 2012-05-14 LAB — HEMOGLOBIN AND HEMATOCRIT, BLOOD: Hemoglobin: 13.2 g/dL (ref 12.0–15.0)

## 2012-05-14 LAB — BASIC METABOLIC PANEL
BUN: 7 mg/dL (ref 6–23)
Calcium: 9.1 mg/dL (ref 8.4–10.5)
Creatinine, Ser: 0.86 mg/dL (ref 0.50–1.10)
GFR calc Af Amer: 73 mL/min — ABNORMAL LOW (ref >90)
GFR calc non Af Amer: 63 mL/min — ABNORMAL LOW (ref >90)
Potassium: 3.8 mEq/L (ref 3.5–5.1)

## 2012-05-16 MED ORDER — LIDOCAINE HCL (PF) 1 % IJ SOLN
INTRAMUSCULAR | Status: AC
  Filled 2012-05-16: qty 2

## 2012-05-16 MED ORDER — NEOMYCIN-POLYMYXIN-DEXAMETH 3.5-10000-0.1 OP OINT
TOPICAL_OINTMENT | OPHTHALMIC | Status: AC
  Filled 2012-05-16: qty 3.5

## 2012-05-16 MED ORDER — LIDOCAINE HCL 3.5 % OP GEL
OPHTHALMIC | Status: AC
  Filled 2012-05-16: qty 5

## 2012-05-16 MED ORDER — CYCLOPENTOLATE-PHENYLEPHRINE 0.2-1 % OP SOLN
OPHTHALMIC | Status: AC
  Filled 2012-05-16: qty 2

## 2012-05-16 MED ORDER — PHENYLEPHRINE HCL 2.5 % OP SOLN
OPHTHALMIC | Status: AC
  Filled 2012-05-16: qty 2

## 2012-05-16 MED ORDER — TETRACAINE HCL 0.5 % OP SOLN
OPHTHALMIC | Status: AC
  Filled 2012-05-16: qty 2

## 2012-05-17 ENCOUNTER — Ambulatory Visit (HOSPITAL_COMMUNITY)
Admission: RE | Admit: 2012-05-17 | Discharge: 2012-05-17 | Disposition: A | Discharge status: Home / Self Care | Payer: Medicare Other | Source: Ambulatory Visit | Attending: Ophthalmology | Admitting: Ophthalmology

## 2012-05-17 ENCOUNTER — Encounter (HOSPITAL_COMMUNITY): Payer: Self-pay | Admitting: *Deleted

## 2012-05-17 ENCOUNTER — Encounter (HOSPITAL_COMMUNITY): Payer: Self-pay | Admitting: Anesthesiology

## 2012-05-17 ENCOUNTER — Encounter (HOSPITAL_COMMUNITY)
Admission: RE | Disposition: A | Discharge status: Home / Self Care | Payer: Self-pay | Source: Ambulatory Visit | Attending: Ophthalmology

## 2012-05-17 ENCOUNTER — Ambulatory Visit (HOSPITAL_COMMUNITY): Payer: Medicare Other | Admitting: Anesthesiology

## 2012-05-17 DIAGNOSIS — Z01812 Encounter for preprocedural laboratory examination: Secondary | ICD-10-CM | POA: Insufficient documentation

## 2012-05-17 DIAGNOSIS — H269 Unspecified cataract: Secondary | ICD-10-CM | POA: Diagnosis not present

## 2012-05-17 DIAGNOSIS — I10 Essential (primary) hypertension: Secondary | ICD-10-CM | POA: Insufficient documentation

## 2012-05-17 DIAGNOSIS — H2589 Other age-related cataract: Secondary | ICD-10-CM | POA: Insufficient documentation

## 2012-05-17 HISTORY — PX: CATARACT EXTRACTION W/PHACO: SHX586

## 2012-05-17 SURGERY — PHACOEMULSIFICATION, CATARACT, WITH IOL INSERTION
Anesthesia: Monitor Anesthesia Care | Site: Eye | Laterality: Right | Wound class: Clean

## 2012-05-17 MED ORDER — LIDOCAINE 3.5 % OP GEL OPTIME - NO CHARGE: OPHTHALMIC | Status: DC | PRN

## 2012-05-17 MED ORDER — LIDOCAINE HCL (PF) 1 % IJ SOLN
INTRAMUSCULAR | Status: DC | PRN
  Administered 2012-05-17: .4 mL

## 2012-05-17 MED ORDER — LIDOCAINE HCL 3.5 % OP GEL: 1.0000 "application " | Freq: Once | OPHTHALMIC | Status: DC

## 2012-05-17 MED ORDER — LACTATED RINGERS IV SOLN
INTRAVENOUS | Status: DC
  Administered 2012-05-17: 1000 mL via INTRAVENOUS

## 2012-05-17 MED ORDER — ONDANSETRON HCL 4 MG/2ML IJ SOLN: 4.0000 mg | Freq: Once | INTRAMUSCULAR | Status: DC | PRN

## 2012-05-17 MED ORDER — PHENYLEPHRINE HCL 2.5 % OP SOLN
1.0000 [drp] | OPHTHALMIC | Status: AC
  Administered 2012-05-17 (×3): 1 [drp] via OPHTHALMIC

## 2012-05-17 MED ORDER — TETRACAINE HCL 0.5 % OP SOLN
1.0000 [drp] | OPHTHALMIC | Status: AC
  Administered 2012-05-17 (×3): 1 [drp] via OPHTHALMIC

## 2012-05-17 MED ORDER — POVIDONE-IODINE 5 % OP SOLN
OPHTHALMIC | Status: DC | PRN
  Administered 2012-05-17: 1 via OPHTHALMIC

## 2012-05-17 MED ORDER — NEOMYCIN-POLYMYXIN-DEXAMETH 0.1 % OP OINT
TOPICAL_OINTMENT | OPHTHALMIC | Status: DC | PRN
  Administered 2012-05-17: 1 via OPHTHALMIC

## 2012-05-17 MED ORDER — EPINEPHRINE HCL 1 MG/ML IJ SOLN
INTRAMUSCULAR | Status: AC
  Filled 2012-05-17: qty 1

## 2012-05-17 MED ORDER — LIDOCAINE HCL (PF) 1 % IJ SOLN
INTRAOCULAR | Status: DC | PRN
  Administered 2012-05-17: 15:00:00 via OPHTHALMIC

## 2012-05-17 MED ORDER — CYCLOPENTOLATE-PHENYLEPHRINE 0.2-1 % OP SOLN
1.0000 [drp] | OPHTHALMIC | Status: AC
  Administered 2012-05-17 (×3): 1 [drp] via OPHTHALMIC

## 2012-05-17 MED ORDER — FENTANYL CITRATE 0.05 MG/ML IJ SOLN: 25.0000 ug | INTRAMUSCULAR | Status: DC | PRN

## 2012-05-17 MED ORDER — PROVISC 10 MG/ML IO SOLN
INTRAOCULAR | Status: DC | PRN
  Administered 2012-05-17: 8.5 mg via INTRAOCULAR

## 2012-05-17 MED ORDER — MIDAZOLAM HCL 2 MG/2ML IJ SOLN
INTRAMUSCULAR | Status: AC
  Filled 2012-05-17: qty 2

## 2012-05-17 MED ORDER — EPINEPHRINE HCL 1 MG/ML IJ SOLN
INTRAOCULAR | Status: DC | PRN
  Administered 2012-05-17: 15:00:00

## 2012-05-17 MED ORDER — BSS IO SOLN
INTRAOCULAR | Status: DC | PRN
  Administered 2012-05-17: 15 mL via INTRAOCULAR

## 2012-05-17 MED ORDER — MIDAZOLAM HCL 2 MG/2ML IJ SOLN
1.0000 mg | INTRAMUSCULAR | Status: DC | PRN
  Administered 2012-05-17: 2 mg via INTRAVENOUS

## 2012-05-17 SURGICAL SUPPLY — 32 items

## 2012-05-17 NOTE — Anesthesia Postprocedure Evaluation (Signed)
  Anesthesia Post-op Note  Patient: Sandra Reid  Procedure(s) Performed: Procedure(s) (LRB) with comments: CATARACT EXTRACTION PHACO AND INTRAOCULAR LENS PLACEMENT (IOC) (Right) - CDE:13.59  Patient Location: PACU and Short Stay  Anesthesia Type:MAC  Level of Consciousness: awake, alert , oriented and patient cooperative  Airway and Oxygen Therapy: Patient Spontanous Breathing  Post-op Pain: none  Post-op Assessment: Post-op Vital signs reviewed, Patient's Cardiovascular Status Stable, Respiratory Function Stable, Patent Airway, No signs of Nausea or vomiting, Adequate PO intake and Pain level controlled  Post-op Vital Signs: Reviewed and stable  Complications: No apparent anesthesia complications

## 2012-05-17 NOTE — H&P (Signed)
I have reviewed the H&P, the patient was re-examined, and I have identified no interval changes in medical condition and plan of care since the history and physical of record  

## 2012-05-17 NOTE — Brief Op Note (Signed)
Pre-Op Dx: Cataract OD Post-Op Dx: Cataract OD Surgeon: Sobia Karger Anesthesia: Topical with MAC Surgery: Cataract Extraction with Intraocular lens Implant OD Implant: B&L enVista Specimen: None Complications: None 

## 2012-05-17 NOTE — Anesthesia Preprocedure Evaluation (Signed)
Anesthesia Evaluation  Patient identified by MRN, date of birth, ID band Patient awake    Reviewed: Allergy & Precautions, H&P , NPO status , Patient's Chart, lab work & pertinent test results  Airway Mallampati: II      Dental  (+) Upper Dentures and Edentulous Lower   Pulmonary  Allergies  breath sounds clear to auscultation        Cardiovascular hypertension, Pt. on medications Rhythm:Regular     Neuro/Psych Anxiety    GI/Hepatic   Endo/Other    Renal/GU      Musculoskeletal   Abdominal   Peds  Hematology   Anesthesia Other Findings   Reproductive/Obstetrics                           Anesthesia Physical Anesthesia Plan  ASA: II  Anesthesia Plan: MAC   Post-op Pain Management:    Induction: Intravenous  Airway Management Planned: Nasal Cannula  Additional Equipment:   Intra-op Plan:   Post-operative Plan:   Informed Consent: I have reviewed the patients History and Physical, chart, labs and discussed the procedure including the risks, benefits and alternatives for the proposed anesthesia with the patient or authorized representative who has indicated his/her understanding and acceptance.     Plan Discussed with:   Anesthesia Plan Comments:         Anesthesia Quick Evaluation

## 2012-05-17 NOTE — Transfer of Care (Signed)
Immediate Anesthesia Transfer of Care Note  Patient: Sandra Reid  Procedure(s) Performed: Procedure(s) (LRB) with comments: CATARACT EXTRACTION PHACO AND INTRAOCULAR LENS PLACEMENT (IOC) (Right) - CDE:13.59  Patient Location: PACU and Short Stay  Anesthesia Type:MAC  Level of Consciousness: awake, alert , oriented and patient cooperative  Airway & Oxygen Therapy: Patient Spontanous Breathing  Post-op Assessment: Report given to PACU RN and Post -op Vital signs reviewed and stable  Post vital signs: Reviewed and stable  Complications: No apparent anesthesia complications

## 2012-05-18 NOTE — Op Note (Signed)
Sandra Reid, Sandra Reid                ACCOUNT NO.:  [id number]  MEDICAL RECORD NO.:  [id number]  LOCATION:  [facility]                          FACILITY:  [facility]  PHYSICIAN:  Susanne Greenhouse, MD       DATE OF BIRTH:  04-20-1931  DATE OF PROCEDURE:  05/17/2012 DATE OF DISCHARGE:  05/17/2012                              OPERATIVE REPORT   PREOPERATIVE DIAGNOSIS:  Combined cataract, right eye, diagnosis code 366.19.  POSTOPERATIVE DIAGNOSIS:  Combined cataract, right eye, diagnosis code 366.19.  OPERATION PERFORMED:  Phacoemulsification with posterior chamber intraocular lens implantation, right eye.  SURGEON:  Bonne Dolores. Clemma Johnsen, MD  ANESTHESIA:  Topical with monitored anesthesia care and IV sedation.  OPERATIVE SUMMARY:  In the preoperative area, dilating drops were placed into the right eye.  The patient was then brought into the operating room where she was placed under general anesthesia.  The eye was then prepped and draped.  Beginning with a 75 blade, a paracentesis port was made at the surgeon's 2 o'clock position.  The anterior chamber was then filled with a 1% nonpreserved lidocaine solution with epinephrine.  This was followed by Viscoat to deepen the chamber.  A small fornix-based peritomy was performed superiorly.  Next, a single iris hook was placed through the limbus superiorly.  A 2.4-mm keratome blade was then used to make a clear corneal incision over the iris hook.  A bent cystotome needle and Utrata forceps were used to create a continuous tear capsulotomy.  Hydrodissection was performed using balanced salt solution on a fine cannula.  The lens nucleus was then removed using phacoemulsification in a quadrant cracking technique.  The cortical material was then removed with irrigation and aspiration.  The capsular bag and anterior chamber were refilled with Provisc.  The wound was widened to approximately 3 mm and a posterior chamber intraocular lens was placed into the capsular  bag without difficulty using an [employer] lens injecting system.  A single 10-0 nylon suture was then used to close the incision as well as stromal hydration.  The Provisc was removed from the anterior chamber and capsular bag with irrigation and aspiration.  At this point, the wounds were tested for leak, which were negative.  The anterior chamber remained deep and stable.  The patient tolerated the procedure well.  There were no operative complications, and she awoke from general anesthesia without problem.  No surgical specimens.  Prosthetic device used is a Theme park manager, model EnVista, model number MX60, power of 20.5, serial number is [id number].          ______________________________ Susanne Greenhouse, MD     KEH/MEDQ  D:  05/17/2012  T:  05/18/2012  Job:  [id number]

## 2012-05-21 ENCOUNTER — Encounter (HOSPITAL_COMMUNITY): Payer: Self-pay | Admitting: Ophthalmology

## 2012-07-20 DIAGNOSIS — J029 Acute pharyngitis, unspecified: Secondary | ICD-10-CM | POA: Diagnosis not present

## 2012-08-14 DIAGNOSIS — J019 Acute sinusitis, unspecified: Secondary | ICD-10-CM | POA: Diagnosis not present

## 2012-08-14 DIAGNOSIS — Z6827 Body mass index (BMI) 27.0-27.9, adult: Secondary | ICD-10-CM | POA: Diagnosis not present

## 2012-08-14 DIAGNOSIS — I10 Essential (primary) hypertension: Secondary | ICD-10-CM | POA: Diagnosis not present

## 2012-09-04 DIAGNOSIS — B351 Tinea unguium: Secondary | ICD-10-CM | POA: Diagnosis not present

## 2012-10-05 DIAGNOSIS — I10 Essential (primary) hypertension: Secondary | ICD-10-CM | POA: Diagnosis not present

## 2012-10-05 DIAGNOSIS — F411 Generalized anxiety disorder: Secondary | ICD-10-CM | POA: Diagnosis not present

## 2012-10-05 DIAGNOSIS — J45909 Unspecified asthma, uncomplicated: Secondary | ICD-10-CM | POA: Diagnosis not present

## 2012-10-05 DIAGNOSIS — Z6825 Body mass index (BMI) 25.0-25.9, adult: Secondary | ICD-10-CM | POA: Diagnosis not present

## 2012-11-09 DIAGNOSIS — Z6826 Body mass index (BMI) 26.0-26.9, adult: Secondary | ICD-10-CM | POA: Diagnosis not present

## 2012-11-09 DIAGNOSIS — E785 Hyperlipidemia, unspecified: Secondary | ICD-10-CM | POA: Diagnosis not present

## 2012-11-09 DIAGNOSIS — I10 Essential (primary) hypertension: Secondary | ICD-10-CM | POA: Diagnosis not present

## 2012-11-09 DIAGNOSIS — J019 Acute sinusitis, unspecified: Secondary | ICD-10-CM | POA: Diagnosis not present

## 2012-12-11 DIAGNOSIS — B351 Tinea unguium: Secondary | ICD-10-CM | POA: Diagnosis not present

## 2013-05-20 DIAGNOSIS — Z6825 Body mass index (BMI) 25.0-25.9, adult: Secondary | ICD-10-CM | POA: Diagnosis not present

## 2013-05-20 DIAGNOSIS — J209 Acute bronchitis, unspecified: Secondary | ICD-10-CM | POA: Diagnosis not present

## 2013-05-20 DIAGNOSIS — I10 Essential (primary) hypertension: Secondary | ICD-10-CM | POA: Diagnosis not present

## 2013-05-20 DIAGNOSIS — J0100 Acute maxillary sinusitis, unspecified: Secondary | ICD-10-CM | POA: Diagnosis not present

## 2013-08-06 DIAGNOSIS — B351 Tinea unguium: Secondary | ICD-10-CM | POA: Diagnosis not present

## 2013-08-06 DIAGNOSIS — B353 Tinea pedis: Secondary | ICD-10-CM | POA: Diagnosis not present

## 2013-09-17 DIAGNOSIS — B353 Tinea pedis: Secondary | ICD-10-CM | POA: Diagnosis not present

## 2013-12-11 DIAGNOSIS — Z6825 Body mass index (BMI) 25.0-25.9, adult: Secondary | ICD-10-CM | POA: Diagnosis not present

## 2013-12-11 DIAGNOSIS — I10 Essential (primary) hypertension: Secondary | ICD-10-CM | POA: Diagnosis not present

## 2013-12-16 DIAGNOSIS — E785 Hyperlipidemia, unspecified: Secondary | ICD-10-CM | POA: Diagnosis not present

## 2013-12-16 DIAGNOSIS — I10 Essential (primary) hypertension: Secondary | ICD-10-CM | POA: Diagnosis not present

## 2013-12-16 DIAGNOSIS — Z6826 Body mass index (BMI) 26.0-26.9, adult: Secondary | ICD-10-CM | POA: Diagnosis not present

## 2013-12-17 DIAGNOSIS — B351 Tinea unguium: Secondary | ICD-10-CM | POA: Diagnosis not present

## 2014-03-18 DIAGNOSIS — B351 Tinea unguium: Secondary | ICD-10-CM | POA: Diagnosis not present

## 2014-06-17 DIAGNOSIS — M79676 Pain in unspecified toe(s): Secondary | ICD-10-CM | POA: Diagnosis not present

## 2014-06-17 DIAGNOSIS — B351 Tinea unguium: Secondary | ICD-10-CM | POA: Diagnosis not present

## 2014-08-26 DIAGNOSIS — B351 Tinea unguium: Secondary | ICD-10-CM | POA: Diagnosis not present

## 2014-08-26 DIAGNOSIS — M79676 Pain in unspecified toe(s): Secondary | ICD-10-CM | POA: Diagnosis not present

## 2014-08-29 DIAGNOSIS — F419 Anxiety disorder, unspecified: Secondary | ICD-10-CM | POA: Diagnosis not present

## 2014-08-29 DIAGNOSIS — Z6826 Body mass index (BMI) 26.0-26.9, adult: Secondary | ICD-10-CM | POA: Diagnosis not present

## 2014-08-29 DIAGNOSIS — I10 Essential (primary) hypertension: Secondary | ICD-10-CM | POA: Diagnosis not present

## 2014-11-25 DIAGNOSIS — M79676 Pain in unspecified toe(s): Secondary | ICD-10-CM | POA: Diagnosis not present

## 2014-11-25 DIAGNOSIS — B351 Tinea unguium: Secondary | ICD-10-CM | POA: Diagnosis not present

## 2015-03-02 ENCOUNTER — Other Ambulatory Visit (HOSPITAL_COMMUNITY): Payer: Self-pay | Admitting: Family Medicine

## 2015-03-02 ENCOUNTER — Ambulatory Visit (HOSPITAL_COMMUNITY)
Admission: RE | Admit: 2015-03-02 | Discharge: 2015-03-02 | Disposition: A | Discharge status: Home / Self Care | Payer: Medicare Other | Source: Ambulatory Visit | Attending: Family Medicine | Admitting: Family Medicine

## 2015-03-02 DIAGNOSIS — E785 Hyperlipidemia, unspecified: Secondary | ICD-10-CM | POA: Diagnosis not present

## 2015-03-02 DIAGNOSIS — R0789 Other chest pain: Secondary | ICD-10-CM

## 2015-03-02 DIAGNOSIS — Z6826 Body mass index (BMI) 26.0-26.9, adult: Secondary | ICD-10-CM | POA: Diagnosis not present

## 2015-03-02 DIAGNOSIS — I10 Essential (primary) hypertension: Secondary | ICD-10-CM | POA: Diagnosis not present

## 2015-03-02 DIAGNOSIS — Z1389 Encounter for screening for other disorder: Secondary | ICD-10-CM | POA: Diagnosis not present

## 2015-03-02 DIAGNOSIS — L989 Disorder of the skin and subcutaneous tissue, unspecified: Secondary | ICD-10-CM | POA: Diagnosis not present

## 2015-03-02 DIAGNOSIS — R079 Chest pain, unspecified: Secondary | ICD-10-CM | POA: Insufficient documentation

## 2015-03-02 DIAGNOSIS — J310 Chronic rhinitis: Secondary | ICD-10-CM | POA: Diagnosis not present

## 2015-03-27 DIAGNOSIS — M79676 Pain in unspecified toe(s): Secondary | ICD-10-CM | POA: Diagnosis not present

## 2015-03-27 DIAGNOSIS — B351 Tinea unguium: Secondary | ICD-10-CM | POA: Diagnosis not present

## 2015-04-02 DIAGNOSIS — Z1283 Encounter for screening for malignant neoplasm of skin: Secondary | ICD-10-CM | POA: Diagnosis not present

## 2015-04-16 DIAGNOSIS — E663 Overweight: Secondary | ICD-10-CM | POA: Diagnosis not present

## 2015-04-16 DIAGNOSIS — Z6826 Body mass index (BMI) 26.0-26.9, adult: Secondary | ICD-10-CM | POA: Diagnosis not present

## 2015-04-16 DIAGNOSIS — F419 Anxiety disorder, unspecified: Secondary | ICD-10-CM | POA: Diagnosis not present

## 2015-04-16 DIAGNOSIS — Z1389 Encounter for screening for other disorder: Secondary | ICD-10-CM | POA: Diagnosis not present

## 2015-04-16 DIAGNOSIS — Z23 Encounter for immunization: Secondary | ICD-10-CM | POA: Diagnosis not present

## 2015-05-07 ENCOUNTER — Ambulatory Visit (INDEPENDENT_AMBULATORY_CARE_PROVIDER_SITE_OTHER): Payer: Medicare Other | Admitting: Otolaryngology

## 2015-05-07 DIAGNOSIS — J342 Deviated nasal septum: Secondary | ICD-10-CM | POA: Diagnosis not present

## 2015-06-12 DIAGNOSIS — B351 Tinea unguium: Secondary | ICD-10-CM | POA: Diagnosis not present

## 2015-06-12 DIAGNOSIS — M79676 Pain in unspecified toe(s): Secondary | ICD-10-CM | POA: Diagnosis not present

## 2015-07-02 DIAGNOSIS — J45909 Unspecified asthma, uncomplicated: Secondary | ICD-10-CM | POA: Diagnosis not present

## 2015-07-02 DIAGNOSIS — L259 Unspecified contact dermatitis, unspecified cause: Secondary | ICD-10-CM | POA: Diagnosis not present

## 2015-07-02 DIAGNOSIS — Z6826 Body mass index (BMI) 26.0-26.9, adult: Secondary | ICD-10-CM | POA: Diagnosis not present

## 2015-08-11 DIAGNOSIS — H10502 Unspecified blepharoconjunctivitis, left eye: Secondary | ICD-10-CM | POA: Diagnosis not present

## 2015-08-21 DIAGNOSIS — B351 Tinea unguium: Secondary | ICD-10-CM | POA: Diagnosis not present

## 2015-08-21 DIAGNOSIS — M79676 Pain in unspecified toe(s): Secondary | ICD-10-CM | POA: Diagnosis not present

## 2015-09-21 DIAGNOSIS — J209 Acute bronchitis, unspecified: Secondary | ICD-10-CM | POA: Diagnosis not present

## 2015-09-21 DIAGNOSIS — J0140 Acute pansinusitis, unspecified: Secondary | ICD-10-CM | POA: Diagnosis not present

## 2015-09-21 DIAGNOSIS — I10 Essential (primary) hypertension: Secondary | ICD-10-CM | POA: Diagnosis not present

## 2015-09-21 DIAGNOSIS — Z1389 Encounter for screening for other disorder: Secondary | ICD-10-CM | POA: Diagnosis not present

## 2015-09-21 DIAGNOSIS — F419 Anxiety disorder, unspecified: Secondary | ICD-10-CM | POA: Diagnosis not present

## 2015-09-21 DIAGNOSIS — E663 Overweight: Secondary | ICD-10-CM | POA: Diagnosis not present

## 2015-09-21 DIAGNOSIS — Z6826 Body mass index (BMI) 26.0-26.9, adult: Secondary | ICD-10-CM | POA: Diagnosis not present

## 2015-09-21 DIAGNOSIS — E782 Mixed hyperlipidemia: Secondary | ICD-10-CM | POA: Diagnosis not present

## 2015-09-29 ENCOUNTER — Other Ambulatory Visit: Payer: Self-pay | Admitting: Internal Medicine

## 2015-09-29 DIAGNOSIS — Z1231 Encounter for screening mammogram for malignant neoplasm of breast: Secondary | ICD-10-CM

## 2015-09-30 DIAGNOSIS — Z1389 Encounter for screening for other disorder: Secondary | ICD-10-CM | POA: Diagnosis not present

## 2015-09-30 DIAGNOSIS — I10 Essential (primary) hypertension: Secondary | ICD-10-CM | POA: Diagnosis not present

## 2015-09-30 DIAGNOSIS — J019 Acute sinusitis, unspecified: Secondary | ICD-10-CM | POA: Diagnosis not present

## 2015-09-30 DIAGNOSIS — J9801 Acute bronchospasm: Secondary | ICD-10-CM | POA: Diagnosis not present

## 2015-09-30 DIAGNOSIS — J209 Acute bronchitis, unspecified: Secondary | ICD-10-CM | POA: Diagnosis not present

## 2015-09-30 DIAGNOSIS — Z6826 Body mass index (BMI) 26.0-26.9, adult: Secondary | ICD-10-CM | POA: Diagnosis not present

## 2015-10-30 DIAGNOSIS — B351 Tinea unguium: Secondary | ICD-10-CM | POA: Diagnosis not present

## 2015-10-30 DIAGNOSIS — M79676 Pain in unspecified toe(s): Secondary | ICD-10-CM | POA: Diagnosis not present

## 2015-12-03 DIAGNOSIS — Z6826 Body mass index (BMI) 26.0-26.9, adult: Secondary | ICD-10-CM | POA: Diagnosis not present

## 2015-12-03 DIAGNOSIS — F419 Anxiety disorder, unspecified: Secondary | ICD-10-CM | POA: Diagnosis not present

## 2015-12-03 DIAGNOSIS — Z0000 Encounter for general adult medical examination without abnormal findings: Secondary | ICD-10-CM | POA: Diagnosis not present

## 2016-01-08 DIAGNOSIS — M79676 Pain in unspecified toe(s): Secondary | ICD-10-CM | POA: Diagnosis not present

## 2016-01-08 DIAGNOSIS — B351 Tinea unguium: Secondary | ICD-10-CM | POA: Diagnosis not present

## 2016-02-19 DIAGNOSIS — L309 Dermatitis, unspecified: Secondary | ICD-10-CM | POA: Diagnosis not present

## 2016-03-25 DIAGNOSIS — B351 Tinea unguium: Secondary | ICD-10-CM | POA: Diagnosis not present

## 2016-03-25 DIAGNOSIS — M79676 Pain in unspecified toe(s): Secondary | ICD-10-CM | POA: Diagnosis not present

## 2016-04-07 DIAGNOSIS — E663 Overweight: Secondary | ICD-10-CM | POA: Diagnosis not present

## 2016-04-07 DIAGNOSIS — Z1389 Encounter for screening for other disorder: Secondary | ICD-10-CM | POA: Diagnosis not present

## 2016-04-07 DIAGNOSIS — Z6826 Body mass index (BMI) 26.0-26.9, adult: Secondary | ICD-10-CM | POA: Diagnosis not present

## 2016-04-07 DIAGNOSIS — B355 Tinea imbricata: Secondary | ICD-10-CM | POA: Diagnosis not present

## 2016-06-24 DIAGNOSIS — M79676 Pain in unspecified toe(s): Secondary | ICD-10-CM | POA: Diagnosis not present

## 2016-06-24 DIAGNOSIS — B351 Tinea unguium: Secondary | ICD-10-CM | POA: Diagnosis not present

## 2016-09-02 DIAGNOSIS — B351 Tinea unguium: Secondary | ICD-10-CM | POA: Diagnosis not present

## 2016-09-02 DIAGNOSIS — M79676 Pain in unspecified toe(s): Secondary | ICD-10-CM | POA: Diagnosis not present

## 2016-11-11 DIAGNOSIS — B351 Tinea unguium: Secondary | ICD-10-CM | POA: Diagnosis not present

## 2016-11-11 DIAGNOSIS — M79675 Pain in left toe(s): Secondary | ICD-10-CM | POA: Diagnosis not present

## 2016-11-18 DIAGNOSIS — Z1389 Encounter for screening for other disorder: Secondary | ICD-10-CM | POA: Diagnosis not present

## 2016-11-18 DIAGNOSIS — F419 Anxiety disorder, unspecified: Secondary | ICD-10-CM | POA: Diagnosis not present

## 2016-11-18 DIAGNOSIS — N342 Other urethritis: Secondary | ICD-10-CM | POA: Diagnosis not present

## 2016-11-18 DIAGNOSIS — E669 Obesity, unspecified: Secondary | ICD-10-CM | POA: Diagnosis not present

## 2016-11-18 DIAGNOSIS — Z6826 Body mass index (BMI) 26.0-26.9, adult: Secondary | ICD-10-CM | POA: Diagnosis not present

## 2016-11-18 DIAGNOSIS — E663 Overweight: Secondary | ICD-10-CM | POA: Diagnosis not present

## 2016-11-18 DIAGNOSIS — E782 Mixed hyperlipidemia: Secondary | ICD-10-CM | POA: Diagnosis not present

## 2016-11-18 DIAGNOSIS — I10 Essential (primary) hypertension: Secondary | ICD-10-CM | POA: Diagnosis not present

## 2017-01-04 DIAGNOSIS — E785 Hyperlipidemia, unspecified: Secondary | ICD-10-CM | POA: Diagnosis not present

## 2017-01-04 DIAGNOSIS — E663 Overweight: Secondary | ICD-10-CM | POA: Diagnosis not present

## 2017-01-04 DIAGNOSIS — Z1389 Encounter for screening for other disorder: Secondary | ICD-10-CM | POA: Diagnosis not present

## 2017-01-04 DIAGNOSIS — F419 Anxiety disorder, unspecified: Secondary | ICD-10-CM | POA: Diagnosis not present

## 2017-01-04 DIAGNOSIS — Z6826 Body mass index (BMI) 26.0-26.9, adult: Secondary | ICD-10-CM | POA: Diagnosis not present

## 2017-01-04 DIAGNOSIS — I10 Essential (primary) hypertension: Secondary | ICD-10-CM | POA: Diagnosis not present

## 2017-01-04 DIAGNOSIS — Z0000 Encounter for general adult medical examination without abnormal findings: Secondary | ICD-10-CM | POA: Diagnosis not present

## 2017-03-14 DIAGNOSIS — Z6826 Body mass index (BMI) 26.0-26.9, adult: Secondary | ICD-10-CM | POA: Diagnosis not present

## 2017-03-14 DIAGNOSIS — E663 Overweight: Secondary | ICD-10-CM | POA: Diagnosis not present

## 2017-03-14 DIAGNOSIS — I10 Essential (primary) hypertension: Secondary | ICD-10-CM | POA: Diagnosis not present

## 2017-03-14 DIAGNOSIS — E785 Hyperlipidemia, unspecified: Secondary | ICD-10-CM | POA: Diagnosis not present

## 2017-03-14 DIAGNOSIS — F419 Anxiety disorder, unspecified: Secondary | ICD-10-CM | POA: Diagnosis not present

## 2017-03-14 DIAGNOSIS — Z1389 Encounter for screening for other disorder: Secondary | ICD-10-CM | POA: Diagnosis not present

## 2017-04-10 ENCOUNTER — Emergency Department (HOSPITAL_COMMUNITY)
Admission: EM | Admit: 2017-04-10 | Discharge: 2017-04-11 | Disposition: A | Discharge status: Home / Self Care | Payer: Medicare Other | Attending: Emergency Medicine | Admitting: Emergency Medicine

## 2017-04-10 ENCOUNTER — Encounter (HOSPITAL_COMMUNITY): Payer: Self-pay | Admitting: Emergency Medicine

## 2017-04-10 DIAGNOSIS — I10 Essential (primary) hypertension: Secondary | ICD-10-CM | POA: Diagnosis not present

## 2017-04-10 DIAGNOSIS — Z87891 Personal history of nicotine dependence: Secondary | ICD-10-CM | POA: Diagnosis not present

## 2017-04-10 DIAGNOSIS — R51 Headache: Secondary | ICD-10-CM | POA: Insufficient documentation

## 2017-04-10 DIAGNOSIS — Z79899 Other long term (current) drug therapy: Secondary | ICD-10-CM | POA: Insufficient documentation

## 2017-04-10 LAB — BASIC METABOLIC PANEL
Anion gap: 6 (ref 5–15)
BUN: 7 mg/dL (ref 6–20)
CHLORIDE: 102 mmol/L (ref 101–111)
CO2: 29 mmol/L (ref 22–32)
Calcium: 8.8 mg/dL — ABNORMAL LOW (ref 8.9–10.3)
Creatinine, Ser: 0.88 mg/dL (ref 0.44–1.00)
GFR, EST NON AFRICAN AMERICAN: 59 mL/min — AB (ref >60)
GLUCOSE: 106 mg/dL — AB (ref 65–99)
Potassium: 3.3 mmol/L — ABNORMAL LOW (ref 3.5–5.1)
SODIUM: 137 mmol/L (ref 135–145)

## 2017-04-10 LAB — CBC WITH DIFFERENTIAL/PLATELET
Basophils Absolute: 0 10*3/uL (ref 0.0–0.1)
Basophils Relative: 1 %
EOS ABS: 0.2 10*3/uL (ref 0.0–0.7)
Eosinophils Relative: 4 %
HEMATOCRIT: 39.5 % (ref 36.0–46.0)
Hemoglobin: 13.3 g/dL (ref 12.0–15.0)
LYMPHS ABS: 2.4 10*3/uL (ref 0.7–4.0)
Lymphocytes Relative: 40 %
MCH: 29.9 pg (ref 26.0–34.0)
MCHC: 33.7 g/dL (ref 30.0–36.0)
MCV: 88.8 fL (ref 78.0–100.0)
Monocytes Absolute: 0.4 10*3/uL (ref 0.1–1.0)
Monocytes Relative: 7 %
NEUTROS ABS: 2.8 10*3/uL (ref 1.7–7.7)
Neutrophils Relative %: 48 %
Platelets: 296 10*3/uL (ref 150–400)
RBC: 4.45 MIL/uL (ref 3.87–5.11)
RDW: 13 % (ref 11.5–15.5)
WBC: 5.9 10*3/uL (ref 4.0–10.5)

## 2017-04-10 MED ORDER — LOSARTAN POTASSIUM 50 MG PO TABS
100.0000 mg | ORAL_TABLET | Freq: Every evening | ORAL | Status: DC
  Administered 2017-04-10: 100 mg via ORAL
  Filled 2017-04-10 (×3): qty 2

## 2017-04-10 NOTE — ED Triage Notes (Signed)
Pt taken off valsartan 2 weeks ago by pcp  due to recall and started losartan. States high bp since with ha intermittent. Denies ha at this time. Nad. A/o.

## 2017-04-10 NOTE — ED Provider Notes (Signed)
[place] DEPT Provider Note   CSN: [id number] Arrival date & time: 04/10/17  1714     History   Chief Complaint Chief Complaint  Patient presents with  . Hypertension    HPI Sandra Reid is a 83 y.o. female.  Patient is an 83 year old female with a history of anxiety and hypertension presenting today with high blood pressure. Patient states she had been on valsartan for years and it was recalled. Approximately 2 weeks ago she had stopped the medication. She'll follow-up with her doctor and her initial blood pressure in her doctor's office was 140/80. They gave her prescription for losartan but stated she only needed to start it if her blood pressure started to elevate. Approximately a week and half ago patient checked her blood pressure and it was elevated show she started to losartan. Blood pressure on Thursday and Friday was 150/80. So she stopped checking her blood pressure. She states she normally follows a no salt diet however on Saturday she ate [employer] with fried chicken, mashed potatoes and gravy sure that it had a lot of salt. Yesterday she felt okay this morning when she woke up she had a headache on the top of her head. She states the headache continued to worsen throughout the morning and got to the point where it made her feel slightly lightheaded she denies any blurry vision, syncope, chest pain, shortness of breath or unilateral numbness, weakness, dizziness or speech difficulty. She checked her blood pressure at that time and it was elevated in the 200s. Patient did not take anything for the headache and it spontaneously resolved. She denies having a headache currently but continues to have elevated blood pressures. She has not taken her blood pressure medication today she usually takes it in the evening but didn't take it yesterday.   The history is provided by the patient.    Past Medical History:  Diagnosis Date  . Anxiety   . Hypertension     Patient Active  Problem List   Diagnosis Date Noted  . PHLEBITIS, SUPERFICIAL LEG VEINS 10/14/2008  . BRONCHITIS 10/14/2008  . HYPERLIPIDEMIA 10/13/2008  . HYPERTENSION 10/13/2008    Past Surgical History:  Procedure Laterality Date  . ABDOMINAL HYSTERECTOMY    . APPENDECTOMY    . CATARACT EXTRACTION W/PHACO  03/15/2012   Procedure: CATARACT EXTRACTION PHACO AND INTRAOCULAR LENS PLACEMENT (IOC);  Surgeon: Tonny Branch, MD;  Location: [facility];  Service: Ophthalmology;  Laterality: Left;  CDE:  14.84  . CATARACT EXTRACTION W/PHACO  05/17/2012   Procedure: CATARACT EXTRACTION PHACO AND INTRAOCULAR LENS PLACEMENT (IOC);  Surgeon: Tonny Branch, MD;  Location: [facility];  Service: Ophthalmology;  Laterality: Right;  CDE:13.59  . DILATION AND CURETTAGE OF UTERUS      OB History    No data available       Home Medications    Prior to Admission medications   Medication Sig Start Date End Date Taking? Authorizing Provider  acetaminophen (TYLENOL) 325 MG tablet Take 325 mg by mouth every 6 (six) hours as needed. Pain   Yes [provider]  ALPRAZolam (XANAX) 1 MG tablet Take 0.25-1 mg by mouth daily.   Yes [provider]  clotrimazole-betamethasone (LOTRISONE) cream Apply 1 application topically.  03/16/17  Yes [provider]  losartan (COZAAR) 100 MG tablet Take 100 mg by mouth every evening.  03/28/17  Yes [provider]  montelukast (SINGULAIR) 4 MG chewable tablet Chew 4 mg by mouth daily.  Yes [provider]    Family History History reviewed. No pertinent family history.  Social History Social History  Substance Use Topics  . Smoking status: Former Smoker    Packs/day: 1.00    Years: 7.00    Types: Cigarettes  . Smokeless tobacco: Former Systems developer    Quit date: 03/09/1953  . Alcohol use No     Allergies   Etodolac   Review of Systems Review of Systems  All other systems reviewed and are negative.    Physical Exam Updated Vital Signs BP (!)  202/72   Pulse (!) 56   Temp 97.8 F (36.6 C) (Oral)   Resp 20   Ht 5\' 3"  (1.6 m)   Wt 68 kg (150 lb)   SpO2 100%   BMI 26.57 kg/m   Physical Exam  Constitutional: She is oriented to person, place, and time. She appears well-developed and well-nourished. No distress.  HENT:  Head: Normocephalic and atraumatic.  Mouth/Throat: Oropharynx is clear and moist.  Eyes: Pupils are equal, round, and reactive to light. Conjunctivae and EOM are normal.  Neck: Normal range of motion. Neck supple.  Cardiovascular: Normal rate, regular rhythm and intact distal pulses.   No murmur heard. Pulmonary/Chest: Effort normal and breath sounds normal. No respiratory distress. She has no wheezes. She has no rales.  Abdominal: Soft. She exhibits no distension. There is no tenderness. There is no rebound and no guarding.  Musculoskeletal: Normal range of motion. She exhibits no edema or tenderness.  Neurological: She is alert and oriented to person, place, and time. She has normal strength. No cranial nerve deficit or sensory deficit. Coordination and gait normal.  Skin: Skin is warm and dry. No rash noted. No erythema.  Psychiatric: She has a normal mood and affect. Her behavior is normal.  Nursing note and vitals reviewed.    ED Treatments / Results  Labs (all labs ordered are listed, but only abnormal results are displayed) Labs Reviewed  BASIC METABOLIC PANEL - Abnormal; Notable for the following:       Result Value   Potassium 3.3 (*)    Glucose, Bld 106 (*)    Calcium 8.8 (*)    GFR calc non Af Amer 59 (*)    All other components within normal limits  CBC WITH DIFFERENTIAL/PLATELET    EKG  EKG Interpretation None       Radiology No results found.  Procedures Procedures (including critical care time)  Medications Ordered in ED Medications  losartan (COZAAR) tablet 100 mg (100 mg Oral Given 04/10/17 2302)     Initial Impression / Assessment and Plan / ED Course  I have  reviewed the triage vital signs and the nursing notes.  Pertinent labs & imaging results that were available during my care of the patient were reviewed by me and considered in my medical decision making (see chart for details).    Elderly female presenting today with hypertension. She currently is asymptomatic. She did have headache earlier which is now resolved. She did not have any neurologic deficits at that time and has none now. Low suspicion for intracranial bleed or stroke. Patient has hypertensive but could be related to salty diet in the last few days and patient has not taken her blood pressure medication currently. Patient did not check her blood pressure yesterday so unclear she was hypertensive yesterday or not. Labs showed normal renal function and no significant anemia or other causes for hypertension. Patient was given her losartan  here. Blood pressure is not worsening and patient is asymptomatic. Feel it is reasonable for patient to be discharged home. Continue her losartan and try to avoid salt. She has close follow-up with her PCP.  Final Clinical Impressions(s) / ED Diagnoses   Final diagnoses:  Hypertension, unspecified type    New Prescriptions New Prescriptions   No medications on file     Blanchie Dessert, MD 04/10/17 2340

## 2017-04-10 NOTE — Discharge Instructions (Signed)
Return immediately for change in vision, numbness or weakness on 1 side of the body, difficulty walking or talking, chest pain or shortness of breath.

## 2017-04-11 NOTE — ED Notes (Signed)
Pt alert & oriented x4, stable gait. Patient given discharge instructions, paperwork & prescription(s). Patient verbalized understanding. Pt left department w/ no further questions. Pt escorted to car by this nurse.

## 2017-04-19 DIAGNOSIS — E782 Mixed hyperlipidemia: Secondary | ICD-10-CM | POA: Diagnosis not present

## 2017-04-19 DIAGNOSIS — Z6825 Body mass index (BMI) 25.0-25.9, adult: Secondary | ICD-10-CM | POA: Diagnosis not present

## 2017-04-19 DIAGNOSIS — Z1389 Encounter for screening for other disorder: Secondary | ICD-10-CM | POA: Diagnosis not present

## 2017-04-19 DIAGNOSIS — E876 Hypokalemia: Secondary | ICD-10-CM | POA: Diagnosis not present

## 2017-04-19 DIAGNOSIS — I10 Essential (primary) hypertension: Secondary | ICD-10-CM | POA: Diagnosis not present

## 2017-04-19 DIAGNOSIS — R51 Headache: Secondary | ICD-10-CM | POA: Diagnosis not present

## 2017-04-19 DIAGNOSIS — E663 Overweight: Secondary | ICD-10-CM | POA: Diagnosis not present

## 2017-05-26 DIAGNOSIS — M79674 Pain in right toe(s): Secondary | ICD-10-CM | POA: Diagnosis not present

## 2017-05-26 DIAGNOSIS — B351 Tinea unguium: Secondary | ICD-10-CM | POA: Diagnosis not present

## 2017-05-26 DIAGNOSIS — M79675 Pain in left toe(s): Secondary | ICD-10-CM | POA: Diagnosis not present

## 2017-08-04 DIAGNOSIS — M79675 Pain in left toe(s): Secondary | ICD-10-CM | POA: Diagnosis not present

## 2017-08-04 DIAGNOSIS — B351 Tinea unguium: Secondary | ICD-10-CM | POA: Diagnosis not present

## 2017-08-04 DIAGNOSIS — M79674 Pain in right toe(s): Secondary | ICD-10-CM | POA: Diagnosis not present

## 2017-08-10 DIAGNOSIS — I10 Essential (primary) hypertension: Secondary | ICD-10-CM | POA: Diagnosis not present

## 2017-08-10 DIAGNOSIS — E782 Mixed hyperlipidemia: Secondary | ICD-10-CM | POA: Diagnosis not present

## 2017-08-10 DIAGNOSIS — F419 Anxiety disorder, unspecified: Secondary | ICD-10-CM | POA: Diagnosis not present

## 2017-08-10 DIAGNOSIS — Z6825 Body mass index (BMI) 25.0-25.9, adult: Secondary | ICD-10-CM | POA: Diagnosis not present

## 2017-08-10 DIAGNOSIS — Z1389 Encounter for screening for other disorder: Secondary | ICD-10-CM | POA: Diagnosis not present

## 2017-08-10 DIAGNOSIS — E785 Hyperlipidemia, unspecified: Secondary | ICD-10-CM | POA: Diagnosis not present

## 2017-08-10 DIAGNOSIS — R7309 Other abnormal glucose: Secondary | ICD-10-CM | POA: Diagnosis not present

## 2017-08-14 ENCOUNTER — Other Ambulatory Visit (HOSPITAL_COMMUNITY): Payer: Self-pay | Admitting: Family Medicine

## 2017-08-14 DIAGNOSIS — E2839 Other primary ovarian failure: Secondary | ICD-10-CM

## 2017-08-21 DIAGNOSIS — E782 Mixed hyperlipidemia: Secondary | ICD-10-CM | POA: Diagnosis not present

## 2017-08-21 DIAGNOSIS — Z6825 Body mass index (BMI) 25.0-25.9, adult: Secondary | ICD-10-CM | POA: Diagnosis not present

## 2017-08-21 DIAGNOSIS — R7309 Other abnormal glucose: Secondary | ICD-10-CM | POA: Diagnosis not present

## 2017-08-21 DIAGNOSIS — E663 Overweight: Secondary | ICD-10-CM | POA: Diagnosis not present

## 2017-08-21 DIAGNOSIS — I10 Essential (primary) hypertension: Secondary | ICD-10-CM | POA: Diagnosis not present

## 2017-08-21 DIAGNOSIS — J45901 Unspecified asthma with (acute) exacerbation: Secondary | ICD-10-CM | POA: Diagnosis not present

## 2017-08-21 DIAGNOSIS — J329 Chronic sinusitis, unspecified: Secondary | ICD-10-CM | POA: Diagnosis not present

## 2017-08-24 ENCOUNTER — Encounter (HOSPITAL_COMMUNITY): Payer: Self-pay

## 2017-08-24 ENCOUNTER — Inpatient Hospital Stay (HOSPITAL_COMMUNITY)
Admission: RE | Admit: 2017-08-24 | Discharge: 2017-08-24 | Disposition: A | Discharge status: Home / Self Care | Payer: PRIVATE HEALTH INSURANCE | Source: Ambulatory Visit | Attending: Family Medicine | Admitting: Family Medicine

## 2017-08-25 ENCOUNTER — Other Ambulatory Visit (HOSPITAL_COMMUNITY): Payer: Self-pay | Admitting: Internal Medicine

## 2017-08-25 ENCOUNTER — Ambulatory Visit (HOSPITAL_COMMUNITY)
Admission: RE | Admit: 2017-08-25 | Discharge: 2017-08-25 | Disposition: A | Discharge status: Home / Self Care | Payer: Medicare Other | Source: Ambulatory Visit | Attending: Internal Medicine | Admitting: Internal Medicine

## 2017-08-25 DIAGNOSIS — J9801 Acute bronchospasm: Secondary | ICD-10-CM | POA: Diagnosis not present

## 2017-08-25 DIAGNOSIS — J189 Pneumonia, unspecified organism: Secondary | ICD-10-CM | POA: Diagnosis not present

## 2017-08-25 DIAGNOSIS — R05 Cough: Secondary | ICD-10-CM

## 2017-08-25 DIAGNOSIS — E782 Mixed hyperlipidemia: Secondary | ICD-10-CM | POA: Diagnosis not present

## 2017-08-25 DIAGNOSIS — Z6824 Body mass index (BMI) 24.0-24.9, adult: Secondary | ICD-10-CM | POA: Diagnosis not present

## 2017-08-25 DIAGNOSIS — R059 Cough, unspecified: Secondary | ICD-10-CM

## 2017-10-17 DIAGNOSIS — I10 Essential (primary) hypertension: Secondary | ICD-10-CM | POA: Diagnosis not present

## 2017-10-17 DIAGNOSIS — E782 Mixed hyperlipidemia: Secondary | ICD-10-CM | POA: Diagnosis not present

## 2017-10-17 DIAGNOSIS — Z6824 Body mass index (BMI) 24.0-24.9, adult: Secondary | ICD-10-CM | POA: Diagnosis not present

## 2017-10-17 DIAGNOSIS — F419 Anxiety disorder, unspecified: Secondary | ICD-10-CM | POA: Diagnosis not present

## 2017-10-17 DIAGNOSIS — Z1389 Encounter for screening for other disorder: Secondary | ICD-10-CM | POA: Diagnosis not present

## 2017-11-15 DIAGNOSIS — Z6824 Body mass index (BMI) 24.0-24.9, adult: Secondary | ICD-10-CM | POA: Diagnosis not present

## 2017-11-15 DIAGNOSIS — I872 Venous insufficiency (chronic) (peripheral): Secondary | ICD-10-CM | POA: Diagnosis not present

## 2018-01-02 DIAGNOSIS — Z6824 Body mass index (BMI) 24.0-24.9, adult: Secondary | ICD-10-CM | POA: Diagnosis not present

## 2018-01-02 DIAGNOSIS — L0390 Cellulitis, unspecified: Secondary | ICD-10-CM | POA: Diagnosis not present

## 2018-01-04 DIAGNOSIS — Z1389 Encounter for screening for other disorder: Secondary | ICD-10-CM | POA: Diagnosis not present

## 2018-01-04 DIAGNOSIS — Z0001 Encounter for general adult medical examination with abnormal findings: Secondary | ICD-10-CM | POA: Diagnosis not present

## 2018-01-04 DIAGNOSIS — Z0000 Encounter for general adult medical examination without abnormal findings: Secondary | ICD-10-CM | POA: Diagnosis not present

## 2018-01-04 DIAGNOSIS — Z6824 Body mass index (BMI) 24.0-24.9, adult: Secondary | ICD-10-CM | POA: Diagnosis not present

## 2018-01-04 DIAGNOSIS — E782 Mixed hyperlipidemia: Secondary | ICD-10-CM | POA: Diagnosis not present

## 2018-02-09 DIAGNOSIS — B353 Tinea pedis: Secondary | ICD-10-CM | POA: Diagnosis not present

## 2018-02-09 DIAGNOSIS — M79672 Pain in left foot: Secondary | ICD-10-CM | POA: Diagnosis not present

## 2018-03-23 DIAGNOSIS — B353 Tinea pedis: Secondary | ICD-10-CM | POA: Diagnosis not present

## 2018-03-23 DIAGNOSIS — B351 Tinea unguium: Secondary | ICD-10-CM | POA: Diagnosis not present

## 2018-04-09 DIAGNOSIS — B353 Tinea pedis: Secondary | ICD-10-CM | POA: Diagnosis not present

## 2018-05-10 DIAGNOSIS — I10 Essential (primary) hypertension: Secondary | ICD-10-CM | POA: Diagnosis not present

## 2018-05-10 DIAGNOSIS — F419 Anxiety disorder, unspecified: Secondary | ICD-10-CM | POA: Diagnosis not present

## 2018-05-10 DIAGNOSIS — Z6824 Body mass index (BMI) 24.0-24.9, adult: Secondary | ICD-10-CM | POA: Diagnosis not present

## 2018-08-24 DIAGNOSIS — H16223 Keratoconjunctivitis sicca, not specified as Sjogren's, bilateral: Secondary | ICD-10-CM | POA: Diagnosis not present

## 2018-09-20 DIAGNOSIS — Z6825 Body mass index (BMI) 25.0-25.9, adult: Secondary | ICD-10-CM | POA: Diagnosis not present

## 2018-09-20 DIAGNOSIS — Z1389 Encounter for screening for other disorder: Secondary | ICD-10-CM | POA: Diagnosis not present

## 2018-09-20 DIAGNOSIS — J029 Acute pharyngitis, unspecified: Secondary | ICD-10-CM | POA: Diagnosis not present

## 2018-09-20 DIAGNOSIS — R6889 Other general symptoms and signs: Secondary | ICD-10-CM | POA: Diagnosis not present

## 2018-09-20 DIAGNOSIS — E663 Overweight: Secondary | ICD-10-CM | POA: Diagnosis not present

## 2018-12-13 DIAGNOSIS — J45909 Unspecified asthma, uncomplicated: Secondary | ICD-10-CM | POA: Diagnosis not present

## 2018-12-13 DIAGNOSIS — B351 Tinea unguium: Secondary | ICD-10-CM | POA: Diagnosis not present

## 2018-12-13 DIAGNOSIS — I10 Essential (primary) hypertension: Secondary | ICD-10-CM | POA: Diagnosis not present

## 2018-12-13 DIAGNOSIS — Z79899 Other long term (current) drug therapy: Secondary | ICD-10-CM | POA: Diagnosis not present

## 2018-12-13 DIAGNOSIS — B353 Tinea pedis: Secondary | ICD-10-CM | POA: Diagnosis not present

## 2018-12-13 DIAGNOSIS — Z6825 Body mass index (BMI) 25.0-25.9, adult: Secondary | ICD-10-CM | POA: Diagnosis not present

## 2018-12-13 DIAGNOSIS — E785 Hyperlipidemia, unspecified: Secondary | ICD-10-CM | POA: Diagnosis not present

## 2018-12-13 DIAGNOSIS — R7309 Other abnormal glucose: Secondary | ICD-10-CM | POA: Diagnosis not present

## 2018-12-13 DIAGNOSIS — T50905A Adverse effect of unspecified drugs, medicaments and biological substances, initial encounter: Secondary | ICD-10-CM | POA: Diagnosis not present

## 2018-12-13 DIAGNOSIS — E663 Overweight: Secondary | ICD-10-CM | POA: Diagnosis not present

## 2018-12-17 ENCOUNTER — Other Ambulatory Visit (HOSPITAL_COMMUNITY): Payer: Self-pay | Admitting: Internal Medicine

## 2018-12-17 DIAGNOSIS — E2839 Other primary ovarian failure: Secondary | ICD-10-CM

## 2019-02-14 DIAGNOSIS — Z6824 Body mass index (BMI) 24.0-24.9, adult: Secondary | ICD-10-CM | POA: Diagnosis not present

## 2019-02-14 DIAGNOSIS — Z0000 Encounter for general adult medical examination without abnormal findings: Secondary | ICD-10-CM | POA: Diagnosis not present

## 2019-02-14 DIAGNOSIS — R7309 Other abnormal glucose: Secondary | ICD-10-CM | POA: Diagnosis not present

## 2019-02-14 DIAGNOSIS — E119 Type 2 diabetes mellitus without complications: Secondary | ICD-10-CM | POA: Diagnosis not present

## 2019-02-14 DIAGNOSIS — Z1389 Encounter for screening for other disorder: Secondary | ICD-10-CM | POA: Diagnosis not present

## 2019-04-26 ENCOUNTER — Other Ambulatory Visit: Payer: Self-pay

## 2019-04-26 ENCOUNTER — Emergency Department (HOSPITAL_COMMUNITY)
Admission: EM | Admit: 2019-04-26 | Discharge: 2019-04-26 | Disposition: A | Discharge status: Home / Self Care | Payer: Medicare Other | Attending: Emergency Medicine | Admitting: Emergency Medicine

## 2019-04-26 ENCOUNTER — Encounter (HOSPITAL_COMMUNITY): Payer: Self-pay | Admitting: *Deleted

## 2019-04-26 ENCOUNTER — Emergency Department (HOSPITAL_COMMUNITY): Payer: Medicare Other

## 2019-04-26 DIAGNOSIS — I10 Essential (primary) hypertension: Secondary | ICD-10-CM

## 2019-04-26 DIAGNOSIS — H1033 Unspecified acute conjunctivitis, bilateral: Secondary | ICD-10-CM | POA: Diagnosis not present

## 2019-04-26 DIAGNOSIS — Z79899 Other long term (current) drug therapy: Secondary | ICD-10-CM | POA: Insufficient documentation

## 2019-04-26 DIAGNOSIS — H109 Unspecified conjunctivitis: Secondary | ICD-10-CM

## 2019-04-26 DIAGNOSIS — Z87891 Personal history of nicotine dependence: Secondary | ICD-10-CM | POA: Diagnosis not present

## 2019-04-26 DIAGNOSIS — R519 Headache, unspecified: Secondary | ICD-10-CM | POA: Diagnosis not present

## 2019-04-26 LAB — BASIC METABOLIC PANEL
Anion gap: 9 (ref 5–15)
BUN: 10 mg/dL (ref 8–23)
CO2: 27 mmol/L (ref 22–32)
Calcium: 9.3 mg/dL (ref 8.9–10.3)
Chloride: 102 mmol/L (ref 98–111)
Creatinine, Ser: 0.91 mg/dL (ref 0.44–1.00)
GFR calc Af Amer: >60 mL/min (ref >60)
GFR calc non Af Amer: 58 mL/min — ABNORMAL LOW (ref >60)
Glucose, Bld: 96 mg/dL (ref 70–99)
Potassium: 3.5 mmol/L (ref 3.5–5.1)
Sodium: 138 mmol/L (ref 135–145)

## 2019-04-26 LAB — CBC
HCT: 42.3 % (ref 36.0–46.0)
Hemoglobin: 13.6 g/dL (ref 12.0–15.0)
MCH: 30 pg (ref 26.0–34.0)
MCHC: 32.2 g/dL (ref 30.0–36.0)
MCV: 93.2 fL (ref 80.0–100.0)
Platelets: 314 10*3/uL (ref 150–400)
RBC: 4.54 MIL/uL (ref 3.87–5.11)
RDW: 12.6 % (ref 11.5–15.5)
WBC: 4.9 10*3/uL (ref 4.0–10.5)
nRBC: 0 % (ref 0.0–0.2)

## 2019-04-26 NOTE — Discharge Instructions (Addendum)
Work-up to include CT head without any acute findings.  Labs without any significant abnormality.  Blood pressures been a little on the high side here and very importantly start taking her blood pressure medicine as directed.  Also make an appointment next week to have your blood pressure rechecked.  Both eyes show some mild conjunctivitis.  No significant discharge.  We will hold on giving you any eyedrops at this point in time.  We can follow-up with your primary care doctor next week if it persists.

## 2019-04-26 NOTE — ED Notes (Signed)
Pt denies HA at this this time. Reports headache yesterday for 2-3 hours yesterday. Left eye noted to be red  Reports that she has only been taking one dose of her BP med during day and has been forgetting her bedtime pill all week

## 2019-04-26 NOTE — ED Provider Notes (Signed)
[facility] EMERGENCY DEPARTMENT Provider Note   CSN: [id number] Arrival date & time: 04/26/19  1338     History   Chief Complaint Chief Complaint  Patient presents with  . Headache    HPI Sandra Reid is a 85 y.o. female.     Patient sent in by primary care doctor for the concern of a left frontal headache yesterday that lasted for maybe a 2 hours.  Patient is noticed some redness to her left eye has no real pain there no real itching also I am noticing a little bit of redness to the right eye conjunctiva area as well.  Patient denies any seasonal allergies.  Patient's been getting little bit confused about her blood pressure medicine and she is supposed to take it twice a day but she is only taking it once a day.  Patient today has no complaints.  No weakness no speech problems no headache no numbness no nausea or vomiting.  No visual changes.     Past Medical History:  Diagnosis Date  . Anxiety   . Hypertension     Patient Active Problem List   Diagnosis Date Noted  . PHLEBITIS, SUPERFICIAL LEG VEINS 10/14/2008  . BRONCHITIS 10/14/2008  . HYPERLIPIDEMIA 10/13/2008  . HYPERTENSION 10/13/2008    Past Surgical History:  Procedure Laterality Date  . ABDOMINAL HYSTERECTOMY    . APPENDECTOMY    . CATARACT EXTRACTION W/PHACO  03/15/2012   Procedure: CATARACT EXTRACTION PHACO AND INTRAOCULAR LENS PLACEMENT (IOC);  Surgeon: Tonny Branch, MD;  Location: [facility];  Service: Ophthalmology;  Laterality: Left;  CDE:  14.84  . CATARACT EXTRACTION W/PHACO  05/17/2012   Procedure: CATARACT EXTRACTION PHACO AND INTRAOCULAR LENS PLACEMENT (IOC);  Surgeon: Tonny Branch, MD;  Location: [facility];  Service: Ophthalmology;  Laterality: Right;  CDE:13.59  . DILATION AND CURETTAGE OF UTERUS       OB History   No obstetric history on file.      Home Medications    Prior to Admission medications   Medication Sig Start Date End Date Taking? Authorizing Provider  acetaminophen (TYLENOL) 325  MG tablet Take 325 mg by mouth daily as needed for mild pain or moderate pain. Pain    Yes [provider]  ALPRAZolam (XANAX) 1 MG tablet Take 0.25-1 mg by mouth daily.   Yes [provider]  losartan (COZAAR) 100 MG tablet Take 100 mg by mouth every evening.  03/28/17  Yes [provider]  montelukast (SINGULAIR) 4 MG chewable tablet Chew 4 mg by mouth daily as needed (wheezing).    Yes [provider]    Family History History reviewed. No pertinent family history.  Social History Social History   Tobacco Use  . Smoking status: Former Smoker    Packs/day: 1.00    Years: 7.00    Pack years: 7.00    Types: Cigarettes  . Smokeless tobacco: Former Systems developer    Quit date: 03/09/1953  Substance Use Topics  . Alcohol use: No  . Drug use: No     Allergies   Etodolac   Review of Systems Review of Systems  Constitutional: Negative for chills and fever.  HENT: Negative for congestion, rhinorrhea and sore throat.   Eyes: Positive for redness. Negative for photophobia, pain and visual disturbance.  Respiratory: Negative for cough and shortness of breath.   Cardiovascular: Negative for chest pain and leg swelling.  Gastrointestinal: Negative for abdominal pain, diarrhea, nausea and vomiting.  Genitourinary: Negative for  dysuria.  Musculoskeletal: Negative for back pain and neck pain.  Skin: Negative for rash.  Neurological: Positive for headaches. Negative for dizziness, syncope, speech difficulty, weakness and light-headedness.  Hematological: Does not bruise/bleed easily.  Psychiatric/Behavioral: Negative for confusion.     Physical Exam Updated Vital Signs BP (!) 190/66   Pulse (!) 56   Temp 98.1 F (36.7 C) (Oral)   Resp 15   Ht 1.6 m (5\' 3" )   Wt 63.5 kg   SpO2 99%   BMI 24.80 kg/m   Physical Exam Vitals signs and nursing note reviewed.  Constitutional:      General: She is not in acute distress.    Appearance: Normal appearance.  She is well-developed.  HENT:     Head: Normocephalic and atraumatic.  Eyes:     Extraocular Movements: Extraocular movements intact.     Conjunctiva/sclera: Conjunctivae normal.     Pupils: Pupils are equal, round, and reactive to light.     Comments: Left greater than right bilateral conjunctivitis no discharge.  No hyphema.  Neck:     Musculoskeletal: Neck supple.  Cardiovascular:     Rate and Rhythm: Normal rate and regular rhythm.     Heart sounds: No murmur.  Pulmonary:     Effort: Pulmonary effort is normal. No respiratory distress.     Breath sounds: Normal breath sounds.  Abdominal:     Palpations: Abdomen is soft.     Tenderness: There is no abdominal tenderness.  Musculoskeletal: Normal range of motion.        General: No swelling.  Skin:    General: Skin is warm and dry.  Neurological:     General: No focal deficit present.     Mental Status: She is alert and oriented to person, place, and time.     Cranial Nerves: No cranial nerve deficit.     Sensory: No sensory deficit.     Motor: No weakness.     Comments: Patient with excellent upper extremity and lower extremity strength.  No pronator drift.  No sensory deficit.  Cranial nerves intact.      ED Treatments / Results  Labs (all labs ordered are listed, but only abnormal results are displayed) Labs Reviewed  BASIC METABOLIC PANEL - Abnormal; Notable for the following components:      Result Value   GFR calc non Af Amer 58 (*)    All other components within normal limits  CBC    EKG EKG Interpretation  Date/Time:  Friday April 26 2019 17:53:06 EDT Ventricular Rate:  67 PR Interval:    QRS Duration: 94 QT Interval:  378 QTC Calculation: 399 R Axis:   47 Text Interpretation:  Sinus rhythm Atrial premature complexes Anteroseptal infarct, age indeterminate Lateral leads are also involved Baseline wander in lead(s) V1 V2 No significant change since last tracing Confirmed by Fredia Sorrow [phone]) on  04/26/2019 6:21:21 PM   Radiology Ct Head Wo Contrast  Result Date: 04/26/2019 CLINICAL DATA:  Headache, 2-3 hours EXAM: CT HEAD WITHOUT CONTRAST TECHNIQUE: Contiguous axial images were obtained from the base of the skull through the vertex without intravenous contrast. COMPARISON:  None. FINDINGS: Brain: No evidence of acute territorial infarction, hemorrhage, hydrocephalus,extra-axial collection or mass lesion/mass effect. There is dilatation the ventricles and sulci consistent with age-related atrophy. Low-attenuation changes in the deep white matter consistent with small vessel ischemia. Vascular: No hyperdense vessel or unexpected calcification. Skull: The skull is intact. No fracture or focal lesion identified. Sinuses/Orbits:  The visualized paranasal sinuses and mastoid air cells are clear. The orbits and globes intact. Other: None IMPRESSION: No acute intracranial abnormality. Findings consistent with age related atrophy and chronic small vessel ischemia Electronically Signed   By: Prudencio Pair M.D.   On: 04/26/2019 19:53    Procedures Procedures (including critical care time)  Medications Ordered in ED Medications - No data to display   Initial Impression / Assessment and Plan / ED Course  I have reviewed the triage vital signs and the nursing notes.  Pertinent labs & imaging results that were available during my care of the patient were reviewed by me and considered in my medical decision making (see chart for details).        Patient clearly with mild bilateral conjunctivitis.  Left greater than right.  Very minimal though.  Will not treat with antibiotic ointment or drops at this point in time.  Patient's head CT negative.  Patient's neuro exam without any focal findings at all.  Patient's blood pressure is elevated here pretty much ranging from 1 [id number] systolic.  Patient admits to being confused about how to take her blood pressure medicine she seems to have that clarified.   She will start taking it twice a day as she is post to tomorrow and she will follow up with her primary care doctor by calling for an appointment sometime next week to have blood pressure rechecked.  She will return for any new or worse symptoms.  Final Clinical Impressions(s) / ED Diagnoses   Final diagnoses:  Essential hypertension  Conjunctivitis of both eyes, unspecified conjunctivitis type    ED Discharge Orders    None       Fredia Sorrow, MD 04/26/19 2122

## 2019-04-26 NOTE — ED Triage Notes (Signed)
Patient complains of left sided headache yesterday.  Patient states there is no headache today. Patient called PCP today with same complaint and was advised to come the ED.  Patient has no complaints of nausea or vomiting.

## 2019-06-10 DIAGNOSIS — E7849 Other hyperlipidemia: Secondary | ICD-10-CM | POA: Diagnosis not present

## 2019-06-10 DIAGNOSIS — I10 Essential (primary) hypertension: Secondary | ICD-10-CM | POA: Diagnosis not present

## 2019-06-10 DIAGNOSIS — J45909 Unspecified asthma, uncomplicated: Secondary | ICD-10-CM | POA: Diagnosis not present

## 2019-07-11 DIAGNOSIS — Z87891 Personal history of nicotine dependence: Secondary | ICD-10-CM | POA: Diagnosis not present

## 2019-07-11 DIAGNOSIS — E7849 Other hyperlipidemia: Secondary | ICD-10-CM | POA: Diagnosis not present

## 2019-07-11 DIAGNOSIS — J45909 Unspecified asthma, uncomplicated: Secondary | ICD-10-CM | POA: Diagnosis not present

## 2019-07-11 DIAGNOSIS — I10 Essential (primary) hypertension: Secondary | ICD-10-CM | POA: Diagnosis not present

## 2019-08-11 DIAGNOSIS — Z87891 Personal history of nicotine dependence: Secondary | ICD-10-CM | POA: Diagnosis not present

## 2019-08-11 DIAGNOSIS — I10 Essential (primary) hypertension: Secondary | ICD-10-CM | POA: Diagnosis not present

## 2019-08-11 DIAGNOSIS — E7849 Other hyperlipidemia: Secondary | ICD-10-CM | POA: Diagnosis not present

## 2019-08-11 DIAGNOSIS — J45909 Unspecified asthma, uncomplicated: Secondary | ICD-10-CM | POA: Diagnosis not present

## 2019-09-05 DIAGNOSIS — Z23 Encounter for immunization: Secondary | ICD-10-CM | POA: Diagnosis not present

## 2019-09-06 ENCOUNTER — Emergency Department (HOSPITAL_COMMUNITY): Payer: Medicare Other

## 2019-09-06 ENCOUNTER — Emergency Department (HOSPITAL_COMMUNITY)
Admission: EM | Admit: 2019-09-06 | Discharge: 2019-09-06 | Disposition: A | Discharge status: Home / Self Care | Payer: Medicare Other | Attending: Emergency Medicine | Admitting: Emergency Medicine

## 2019-09-06 ENCOUNTER — Encounter (HOSPITAL_COMMUNITY): Payer: Self-pay

## 2019-09-06 ENCOUNTER — Other Ambulatory Visit: Payer: Self-pay

## 2019-09-06 ENCOUNTER — Emergency Department (HOSPITAL_BASED_OUTPATIENT_CLINIC_OR_DEPARTMENT_OTHER): Payer: Medicare Other

## 2019-09-06 DIAGNOSIS — K5730 Diverticulosis of large intestine without perforation or abscess without bleeding: Secondary | ICD-10-CM | POA: Insufficient documentation

## 2019-09-06 DIAGNOSIS — E876 Hypokalemia: Secondary | ICD-10-CM | POA: Diagnosis not present

## 2019-09-06 DIAGNOSIS — I10 Essential (primary) hypertension: Secondary | ICD-10-CM | POA: Diagnosis not present

## 2019-09-06 DIAGNOSIS — I248 Other forms of acute ischemic heart disease: Secondary | ICD-10-CM | POA: Insufficient documentation

## 2019-09-06 DIAGNOSIS — R000 Tachycardia, unspecified: Secondary | ICD-10-CM | POA: Diagnosis present

## 2019-09-06 DIAGNOSIS — R9431 Abnormal electrocardiogram [ECG] [EKG]: Secondary | ICD-10-CM

## 2019-09-06 DIAGNOSIS — R079 Chest pain, unspecified: Secondary | ICD-10-CM | POA: Insufficient documentation

## 2019-09-06 DIAGNOSIS — Z20822 Contact with and (suspected) exposure to covid-19: Secondary | ICD-10-CM | POA: Diagnosis not present

## 2019-09-06 DIAGNOSIS — Z79899 Other long term (current) drug therapy: Secondary | ICD-10-CM | POA: Diagnosis not present

## 2019-09-06 DIAGNOSIS — Z87891 Personal history of nicotine dependence: Secondary | ICD-10-CM | POA: Insufficient documentation

## 2019-09-06 DIAGNOSIS — R7989 Other specified abnormal findings of blood chemistry: Secondary | ICD-10-CM | POA: Diagnosis not present

## 2019-09-06 DIAGNOSIS — R778 Other specified abnormalities of plasma proteins: Secondary | ICD-10-CM

## 2019-09-06 LAB — COMPREHENSIVE METABOLIC PANEL
ALT: 11 U/L (ref 0–44)
AST: 16 U/L (ref 15–41)
Albumin: 4 g/dL (ref 3.5–5.0)
Alkaline Phosphatase: 99 U/L (ref 38–126)
Anion gap: 11 (ref 5–15)
BUN: 10 mg/dL (ref 8–23)
CO2: 24 mmol/L (ref 22–32)
Calcium: 8.9 mg/dL (ref 8.9–10.3)
Chloride: 103 mmol/L (ref 98–111)
Creatinine, Ser: 0.9 mg/dL (ref 0.44–1.00)
GFR calc Af Amer: >60 mL/min (ref >60)
GFR calc non Af Amer: 58 mL/min — ABNORMAL LOW (ref >60)
Glucose, Bld: 139 mg/dL — ABNORMAL HIGH (ref 70–99)
Potassium: 2.9 mmol/L — ABNORMAL LOW (ref 3.5–5.1)
Sodium: 138 mmol/L (ref 135–145)
Total Bilirubin: 0.9 mg/dL (ref 0.3–1.2)
Total Protein: 7.1 g/dL (ref 6.5–8.1)

## 2019-09-06 LAB — RESPIRATORY PANEL BY RT PCR (FLU A&B, COVID)
Influenza A by PCR: NEGATIVE
Influenza B by PCR: NEGATIVE
SARS Coronavirus 2 by RT PCR: NEGATIVE

## 2019-09-06 LAB — HEMOGLOBIN A1C
Hgb A1c MFr Bld: 5.5 % (ref 4.8–5.6)
Mean Plasma Glucose: 111.15 mg/dL

## 2019-09-06 LAB — LIPID PANEL
Cholesterol: 221 mg/dL — ABNORMAL HIGH (ref 0–200)
HDL: 48 mg/dL (ref >40)
LDL Cholesterol: 145 mg/dL — ABNORMAL HIGH (ref 0–99)
Total CHOL/HDL Ratio: 4.6 RATIO
Triglycerides: 142 mg/dL (ref <150)
VLDL: 28 mg/dL (ref 0–40)

## 2019-09-06 LAB — CBC WITH DIFFERENTIAL/PLATELET
Abs Immature Granulocytes: 0 10*3/uL (ref 0.00–0.07)
Basophils Absolute: 0 10*3/uL (ref 0.0–0.1)
Basophils Relative: 1 %
Eosinophils Absolute: 0.2 10*3/uL (ref 0.0–0.5)
Eosinophils Relative: 4 %
HCT: 42.3 % (ref 36.0–46.0)
Hemoglobin: 13.8 g/dL (ref 12.0–15.0)
Immature Granulocytes: 0 %
Lymphocytes Relative: 24 %
Lymphs Abs: 1.7 10*3/uL (ref 0.7–4.0)
MCH: 30.1 pg (ref 26.0–34.0)
MCHC: 32.6 g/dL (ref 30.0–36.0)
MCV: 92.2 fL (ref 80.0–100.0)
Monocytes Absolute: 0.5 10*3/uL (ref 0.1–1.0)
Monocytes Relative: 7 %
Neutro Abs: 4.4 10*3/uL (ref 1.7–7.7)
Neutrophils Relative %: 64 %
Platelets: 320 10*3/uL (ref 150–400)
RBC: 4.59 MIL/uL (ref 3.87–5.11)
RDW: 12.7 % (ref 11.5–15.5)
WBC: 6.8 10*3/uL (ref 4.0–10.5)
nRBC: 0 % (ref 0.0–0.2)

## 2019-09-06 LAB — TROPONIN I (HIGH SENSITIVITY)
Troponin I (High Sensitivity): 11 ng/L (ref <18)
Troponin I (High Sensitivity): 28 ng/L — ABNORMAL HIGH (ref <18)
Troponin I (High Sensitivity): 40 ng/L — ABNORMAL HIGH (ref <18)
Troponin I (High Sensitivity): 47 ng/L — ABNORMAL HIGH (ref <18)

## 2019-09-06 LAB — PROTIME-INR
INR: 1 (ref 0.8–1.2)
Prothrombin Time: 12.6 seconds (ref 11.4–15.2)

## 2019-09-06 LAB — MAGNESIUM: Magnesium: 2 mg/dL (ref 1.7–2.4)

## 2019-09-06 LAB — APTT: aPTT: 30 seconds (ref 24–36)

## 2019-09-06 LAB — ECHOCARDIOGRAM COMPLETE
Height: 63 in
Weight: 2240 oz

## 2019-09-06 MED ORDER — POTASSIUM CHLORIDE 10 MEQ/100ML IV SOLN
10.0000 meq | INTRAVENOUS | Status: AC
  Administered 2019-09-06: 10 meq via INTRAVENOUS
  Filled 2019-09-06 (×2): qty 100

## 2019-09-06 MED ORDER — AMLODIPINE BESYLATE 5 MG PO TABS: 5.0000 mg | ORAL_TABLET | Freq: Every day | ORAL | 1 refills | Status: DC

## 2019-09-06 MED ORDER — AMLODIPINE BESYLATE 5 MG PO TABS
5.0000 mg | ORAL_TABLET | Freq: Once | ORAL | Status: AC
  Administered 2019-09-06: 5 mg via ORAL
  Filled 2019-09-06: qty 1

## 2019-09-06 MED ORDER — IOHEXOL 350 MG/ML SOLN
100.0000 mL | Freq: Once | INTRAVENOUS | Status: AC | PRN
  Administered 2019-09-06: 100 mL via INTRAVENOUS

## 2019-09-06 MED ORDER — SODIUM CHLORIDE 0.9 % IV SOLN
INTRAVENOUS | Status: DC
  Administered 2019-09-06: 02:00:00 20 mL/h via INTRAVENOUS

## 2019-09-06 MED ORDER — POTASSIUM CHLORIDE CRYS ER 20 MEQ PO TBCR
40.0000 meq | EXTENDED_RELEASE_TABLET | Freq: Once | ORAL | Status: AC
  Administered 2019-09-06: 40 meq via ORAL
  Filled 2019-09-06: qty 2

## 2019-09-06 MED ORDER — POTASSIUM CHLORIDE CRYS ER 20 MEQ PO TBCR: 20.0000 meq | EXTENDED_RELEASE_TABLET | Freq: Every day | ORAL | 0 refills | Status: DC

## 2019-09-06 MED ORDER — METOPROLOL TARTRATE 5 MG/5ML IV SOLN: 5.0000 mg | INTRAVENOUS | Status: DC | PRN

## 2019-09-06 MED ORDER — NITROGLYCERIN IN D5W 200-5 MCG/ML-% IV SOLN
0.0000 ug/min | INTRAVENOUS | Status: DC
  Administered 2019-09-06: 15 ug/min via INTRAVENOUS
  Filled 2019-09-06: qty 250

## 2019-09-06 NOTE — ED Notes (Signed)
Verbal order given by MD to titrate down Nitroglycerin drip.

## 2019-09-06 NOTE — Progress Notes (Signed)
*  PRELIMINARY RESULTS* Echocardiogram 2D Echocardiogram has been performed.  Leavy Cella 09/06/2019, 12:44 PM

## 2019-09-06 NOTE — ED Provider Notes (Signed)
7:05 AM-patient being evaluated for palpitations, following COVID-19 vaccination, first, yesterday.  Cardiology was contacted.  She was treated with IV nitroglycerin, IV metoprolol, and oral potassium.  Troponin climbing, checked 3 times.  Imaging, chest plain images and CTA, negative.  EKG abnormal, stable.  Cardiac echo pending.  Anticipate cardiology consultation in the ED, later this morning.  10:15 AM-cardiology here in [place] was contacted, he did not previously know about this case. He recommends discontinuing nitroglycerin drip at this time which was started for hypertension.  He will ask the echo technician to get the echocardiogram as soon as possible.  2:00 PM-Case discussed with cardiology, Dr. Lucianne Muss states that he does not need to evaluate the patient in the ED and that from his perspective she is stable for discharge.  He agrees with treatment using Norvasc, for hypertension.  Patient has been weaned off nitroglycerin and has a relatively stable with mild elevation of her blood pressure.   Patient Vitals for the past 24 hrs:  BP Temp Temp src Pulse Resp SpO2 Height Weight  09/06/19 1400 (!) 176/66 -- -- -- -- -- -- --  09/06/19 1330 (!) 157/65 -- -- 62 13 97 % -- --  09/06/19 1300 (!) 172/69 -- -- 64 16 99 % -- --  09/06/19 1250 (!) 172/56 -- -- 63 16 97 % -- --  09/06/19 1248 -- -- -- 61 16 100 % -- --  09/06/19 1240 (!) 172/68 -- -- 65 16 98 % -- --  09/06/19 1230 (!) 164/69 -- -- 65 (!) 26 100 % -- --  09/06/19 1220 (!) 162/64 -- -- 66 16 99 % -- --  09/06/19 1215 -- -- -- 62 17 98 % -- --  09/06/19 1150 (!) 171/62 -- -- (!) 58 14 98 % -- --  09/06/19 1145 -- -- -- 60 18 96 % -- --  09/06/19 1140 (!) 162/67 -- -- 63 16 98 % -- --  09/06/19 1130 (!) 184/67 -- -- 65 15 98 % -- --  09/06/19 1120 (!) 175/60 -- -- 67 15 98 % -- --  09/06/19 1110 140/72 -- -- 67 14 96 % -- --  09/06/19 1100 (!) 171/69 -- -- 63 16 97 % -- --  09/06/19 1050 (!) 173/60 -- -- (!) 58 15 97  % -- --  09/06/19 1040 (!) 172/64 -- -- 68 14 97 % -- --  09/06/19 1037 (!) 159/70 98.7 F (37.1 C) Oral 63 13 99 % -- --  09/06/19 1030 (!) 159/70 -- -- 65 15 98 % -- --  09/06/19 0730 (!) 176/69 -- -- 72 16 98 % -- --  09/06/19 0715 -- -- -- 66 13 100 % -- --  09/06/19 0700 (!) 155/64 -- -- 66 12 96 % -- --  09/06/19 0650 (!) 145/62 -- -- 67 13 96 % -- --  09/06/19 0640 (!) 159/60 -- -- 67 14 98 % -- --  09/06/19 0630 (!) 147/63 -- -- 65 13 97 % -- --  09/06/19 0620 137/65 -- -- 68 15 97 % -- --  09/06/19 0600 (!) 164/71 -- -- 65 16 98 % -- --  09/06/19 0530 (!) 175/80 -- -- 76 15 97 % -- --  09/06/19 0520 (!) 144/61 -- -- 70 15 98 % -- --  09/06/19 0500 (!) 151/63 -- -- 71 17 97 % -- --  09/06/19 0430 129/68 -- -- 77 17 97 % -- --  09/06/19 [id number] Marland Kitchen)  147/69 -- -- 71 14 99 % -- --  09/06/19 0410 (!) 155/65 -- -- 72 16 98 % -- --  09/06/19 0400 (!) 166/69 -- -- 79 16 98 % -- --  09/06/19 0330 (!) 194/75 -- -- 91 17 99 % -- --  09/06/19 0310 140/73 -- -- 89 18 97 % -- --  09/06/19 0300 (!) 154/67 -- -- 92 17 96 % -- --  09/06/19 0250 (!) 159/70 -- -- 91 18 96 % -- --  09/06/19 0240 (!) 143/77 -- -- 92 20 97 % -- --  09/06/19 0232 -- -- -- 96 19 96 % -- --  09/06/19 0230 (!) 161/69 -- -- 95 18 96 % -- --  09/06/19 0220 (!) 172/72 -- -- 98 (!) 21 97 % -- --  09/06/19 0210 (!) 163/74 -- -- (!) 101 (!) 29 99 % -- --  09/06/19 0200 (!) 143/80 -- -- 98 (!) 21 96 % -- --  09/06/19 0152 -- -- -- -- -- -- 5\' 3"  (1.6 m) 63.5 kg  09/06/19 0150 (!) 165/77 -- -- 95 19 97 % -- --  09/06/19 0135 (!) 186/79 97.9 F (36.6 C) Oral 94 (!) 23 99 % -- --    2:34 PM Reevaluation with update and discussion. After initial assessment and treatment, an updated evaluation reveals she is comfortable now has no complaint of palpitation or chest pain.  Findings discussed and questions answered. [name]   .Critical Care Performed by: [name], MD Authorized by: [name], MD   Critical  care provider statement:    Critical care time (minutes):  35   Critical care start time:  09/06/2019 7:30 AM   Critical care end time:  09/06/2019 2:36 PM   Critical care time was exclusive of:  Separately billable procedures and treating other patients   Critical care was necessary to treat or prevent imminent or life-threatening deterioration of the following conditions:  Cardiac failure   Critical care was time spent personally by me on the following activities:  Blood draw for specimens, development of treatment plan with patient or surrogate, discussions with consultants, evaluation of patient's response to treatment, examination of patient, obtaining history from patient or surrogate, ordering and performing treatments and interventions, ordering and review of laboratory studies, pulse oximetry, re-evaluation of patient's condition, review of old charts and ordering and review of radiographic studies     Medical Decision Making: Nonspecific palpitations with high blood pressure.  Abnormal EKG, nonspecific, with mild troponin elevation.  Cardiology consultation by telephone, x2.  Patient stabilized on nitroglycerin drip, oral potassium and IV potassium.  Doubt ACS, PE, pneumonia, metabolic instability.  She is stable for discharge, with additional blood pressure control, amlodipine, and a short-term course of potassium.  Sandra Reid was evaluated in Emergency Department on 09/06/2019 for the symptoms described in the history of present illness. She was evaluated in the context of the global COVID-19 pandemic, which necessitated consideration that the patient might be at risk for infection with the SARS-CoV-2 virus that causes COVID-19. Institutional protocols and algorithms that pertain to the evaluation of patients at risk for COVID-19 are in a state of rapid change based on information released by regulatory bodies including the CDC and federal and state organizations. These policies and  algorithms were followed during the patient's care in the ED.   CRITICAL CARE-yes Performed by: [name]  Nursing Notes Reviewed/ Care Coordinated Applicable Imaging Reviewed Interpretation of Laboratory Data incorporated into  ED treatment  The patient appears reasonably screened and/or stabilized for discharge and I doubt any other medical condition or other [facility] requiring further screening, evaluation, or treatment in the ED at this time prior to discharge.  Plan: Home Medications-continue usual medications; Home Treatments-increase potassium in diet; return here if the recommended treatment, does not improve the symptoms; Recommended follow up-PCP, checkup 1 week, cardiology checkup 1 week.    [name], MD 09/06/19 [phone]

## 2019-09-06 NOTE — Discharge Instructions (Addendum)
Start the new blood pressure medication, amlodipine, tomorrow morning.  We are giving you some potassium to help replete your potassium stores.  Try to eat foods which contain more potassium as well.  Follow-up with your PCP next week for blood pressure check and call the cardiologist for follow-up appointment to be seen about the abnormal EKG that we got today.  Return here, if needed, for problems.

## 2019-09-06 NOTE — ED Triage Notes (Signed)
Pt reports "feeling palpitations when lying down" with pain in arms bilaterally and pain shooting through neck. Pt denies pain to chest, but does report feeling "heart beat fast, shaky, and discomfort" - this lasted about 45 minutes. Pt denies pain at this time.

## 2019-09-06 NOTE — ED Provider Notes (Addendum)
[facility] EMERGENCY DEPARTMENT Provider Note   CSN: [id number] Arrival date & time: 09/06/19  0056   Time seen 1:32 AM  History Chief Complaint  Patient presents with  . Palpitations    arm/neck pain    Sandra Reid is a 86 y.o. female.  HPI   Patient states she got her first Covid vaccine, [place] at 10 AM today.  She states she felt fine until about 5:30 PM when she had pain in her upper back between her shoulder blades that lasted 10 to 20 minutes.  She states after it resolved she noticed when she moved her head from side to side it would cause pain in her neck that would radiate up into her head.  About 11 PM when she lay down to go to sleep she noted that she had a "discomfort" in her left side and puts her hand over her left breast.  She states she felt like her heart was beating fast.  She denies having chest pain.  She states that feeling lasted about 45 minutes.  She also complains of pain in both upper arms but worse on the left.  She states her legs felt weak.  Patient has a long history of hypertension she states she usually is well controlled but every once in a while it will be elevated.  She denies any known history of an aneurysm.  PCP Redmond School, MD   Past Medical History:  Diagnosis Date  . Anxiety   . Hypertension     Patient Active Problem List   Diagnosis Date Noted  . PHLEBITIS, SUPERFICIAL LEG VEINS 10/14/2008  . BRONCHITIS 10/14/2008  . HYPERLIPIDEMIA 10/13/2008  . HYPERTENSION 10/13/2008    Past Surgical History:  Procedure Laterality Date  . ABDOMINAL HYSTERECTOMY    . APPENDECTOMY    . CATARACT EXTRACTION W/PHACO  03/15/2012   Procedure: CATARACT EXTRACTION PHACO AND INTRAOCULAR LENS PLACEMENT (IOC);  Surgeon: Tonny Branch, MD;  Location: [facility];  Service: Ophthalmology;  Laterality: Left;  CDE:  14.84  . CATARACT EXTRACTION W/PHACO  05/17/2012   Procedure: CATARACT EXTRACTION PHACO AND INTRAOCULAR LENS PLACEMENT (IOC);  Surgeon: Tonny Branch,  MD;  Location: [facility];  Service: Ophthalmology;  Laterality: Right;  CDE:13.59  . DILATION AND CURETTAGE OF UTERUS       OB History   No obstetric history on file.     No family history on file.  Social History   Tobacco Use  . Smoking status: Former Smoker    Packs/day: 1.00    Years: 7.00    Pack years: 7.00    Types: Cigarettes  . Smokeless tobacco: Former Systems developer    Quit date: 03/09/1953  Substance Use Topics  . Alcohol use: No  . Drug use: No    Home Medications Prior to Admission medications   Medication Sig Start Date End Date Taking? Authorizing Provider  acetaminophen (TYLENOL) 325 MG tablet Take 325 mg by mouth daily as needed for mild pain or moderate pain. Pain     [provider]  ALPRAZolam Duanne Moron) 1 MG tablet Take 0.25-1 mg by mouth daily.    [provider]  losartan (COZAAR) 100 MG tablet Take 100 mg by mouth every evening.  03/28/17   [provider]  montelukast (SINGULAIR) 4 MG chewable tablet Chew 4 mg by mouth daily as needed (wheezing).     [provider]    Allergies    Etodolac  Review of Systems   Review  of Systems  All other systems reviewed and are negative.   Physical Exam Updated Vital Signs BP (!) 145/62   Pulse 67   Temp 97.9 F (36.6 C) (Oral)   Resp 13   Ht 5\' 3"  (1.6 m)   Wt 63.5 kg   SpO2 96%   BMI 24.80 kg/m   Physical Exam  ED Results / Procedures / Treatments   Labs (all labs ordered are listed, but only abnormal results are displayed) Results for orders placed or performed during the hospital encounter of 09/06/19  Respiratory Panel by RT PCR (Flu A&B, Covid) - Nasopharyngeal Swab   Specimen: Nasopharyngeal Swab  Result Value Ref Range   SARS Coronavirus 2 by RT PCR NEGATIVE NEGATIVE   Influenza A by PCR NEGATIVE NEGATIVE   Influenza B by PCR NEGATIVE NEGATIVE  CBC with Differential/Platelet  Result Value Ref Range   WBC 6.8 4.0 - 10.5 K/uL   RBC 4.59 3.87 - 5.11 MIL/uL    Hemoglobin 13.8 12.0 - 15.0 g/dL   HCT 42.3 36.0 - 46.0 %   MCV 92.2 80.0 - 100.0 fL   MCH 30.1 26.0 - 34.0 pg   MCHC 32.6 30.0 - 36.0 g/dL   RDW 12.7 11.5 - 15.5 %   Platelets 320 150 - 400 K/uL   nRBC 0.0 0.0 - 0.2 %   Neutrophils Relative % 64 %   Neutro Abs 4.4 1.7 - 7.7 K/uL   Lymphocytes Relative 24 %   Lymphs Abs 1.7 0.7 - 4.0 K/uL   Monocytes Relative 7 %   Monocytes Absolute 0.5 0.1 - 1.0 K/uL   Eosinophils Relative 4 %   Eosinophils Absolute 0.2 0.0 - 0.5 K/uL   Basophils Relative 1 %   Basophils Absolute 0.0 0.0 - 0.1 K/uL   Immature Granulocytes 0 %   Abs Immature Granulocytes 0.00 0.00 - 0.07 K/uL  Protime-INR  Result Value Ref Range   Prothrombin Time 12.6 11.4 - 15.2 seconds   INR 1.0 0.8 - 1.2  APTT  Result Value Ref Range   aPTT 30 24 - 36 seconds  Comprehensive metabolic panel  Result Value Ref Range   Sodium 138 135 - 145 mmol/L   Potassium 2.9 (L) 3.5 - 5.1 mmol/L   Chloride 103 98 - 111 mmol/L   CO2 24 22 - 32 mmol/L   Glucose, Bld 139 (H) 70 - 99 mg/dL   BUN 10 8 - 23 mg/dL   Creatinine, Ser 0.90 0.44 - 1.00 mg/dL   Calcium 8.9 8.9 - 10.3 mg/dL   Total Protein 7.1 6.5 - 8.1 g/dL   Albumin 4.0 3.5 - 5.0 g/dL   AST 16 15 - 41 U/L   ALT 11 0 - 44 U/L   Alkaline Phosphatase 99 38 - 126 U/L   Total Bilirubin 0.9 0.3 - 1.2 mg/dL   GFR calc non Af Amer 58 (L) >60 mL/min   GFR calc Af Amer >60 >60 mL/min   Anion gap 11 5 - 15  Lipid panel  Result Value Ref Range   Cholesterol 221 (H) 0 - 200 mg/dL   Triglycerides 142 <150 mg/dL   HDL 48 >40 mg/dL   Total CHOL/HDL Ratio 4.6 RATIO   VLDL 28 0 - 40 mg/dL   LDL Cholesterol 145 (H) 0 - 99 mg/dL  Magnesium  Result Value Ref Range   Magnesium 2.0 1.7 - 2.4 mg/dL  Troponin I (High Sensitivity)  Result Value Ref Range   Troponin  I (High Sensitivity) 11 <18 ng/L  Troponin I (High Sensitivity)  Result Value Ref Range   Troponin I (High Sensitivity) 28 (H) <18 ng/L  Troponin I (High Sensitivity)   Result Value Ref Range   Troponin I (High Sensitivity) 40 (H) <18 ng/L   Laboratory interpretation all normal except hypokalemia, positive delta troponin   EKG EKG Interpretation  Date/Time:  Friday September 06 2019 01:28:07 EST Ventricular Rate:  94 PR Interval:    QRS Duration: 97 QT Interval:  374 QTC Calculation: 468 R Axis:   44 Text Interpretation: Sinus rhythm Since last tracing 26 Apr 2019 Repol abnrm suggests ischemia, diffuse leads Baseline wander in lead(s) V3 Confirmed by Rolland Porter [phone]) on 09/06/2019 1:31:14 AM   #2 EKG ED ECG REPORT   Date: 09/06/2019 03:41 AM   Rate: 89  Rhythm: normal sinus rhythm  QRS Axis: normal  Intervals: QT prolonged  ST/T Wave abnormalities: ST depressions inferiorly and ST depressions laterally  Conduction Disutrbances:none  Narrative Interpretation:   Old EKG Reviewed: unchanged from earlier this evening  I have personally reviewed the EKG tracing and agree with the computerized printout as noted.    Radiology DG Chest Port 1 View  Result Date: 09/06/2019 CLINICAL DATA:  Palpitations, chest pain EXAM: PORTABLE CHEST 1 VIEW COMPARISON:  08/25/2017 FINDINGS: Single frontal view of the chest demonstrates a stable cardiac silhouette. Prominent vascular shadows in the paratracheal regions, stable. No airspace disease, effusion, or pneumothorax. No acute bony abnormalities. IMPRESSION: 1. Stable chest, no acute process. Electronically Signed   By: Randa Ngo M.D.   On: 09/06/2019 02:02   CT Angio Chest/Abd/Pel for Dissection W and/or W/WO  Result Date: 09/06/2019 CLINICAL DATA:  Chest pain, interscapular pain, hypertension EXAM: CT ANGIOGRAPHY CHEST, ABDOMEN AND PELVIS TECHNIQUE: Multidetector CT imaging through the chest, abdomen and pelvis was performed using the standard protocol during bolus administration of intravenous contrast. Multiplanar reconstructed images and MIPs were obtained and reviewed to evaluate the vascular  anatomy. CONTRAST:  141mL OMNIPAQUE IOHEXOL 350 MG/ML SOLN COMPARISON:  09/06/2019 FINDINGS: CTA CHEST FINDINGS Cardiovascular: The ascending thoracic aorta measures 3.2 cm in diameter, aortic arch measures 2.6 cm, and descending thoracic aorta measures 2.4 cm. No dissection. The heart is unremarkable without pericardial effusion. While not optimized for opacification of the pulmonary vasculature, there is sufficient contrast enhancement to exclude pulmonary emboli. Mediastinum/Nodes: No enlarged mediastinal, hilar, or axillary lymph nodes. Thyroid is heterogeneous and enlarged, with a 2.8 x 1.9 cm right lobe nodule requiring nonemergent outpatient ultrasound for further characterization. The trachea and esophagus demonstrate no significant findings. Lungs/Pleura: Mild background emphysema. No airspace disease, effusion, or pneumothorax. The central airways are patent. Musculoskeletal: No acute bony abnormalities. Review of the MIP images confirms the above findings. CTA ABDOMEN AND PELVIS FINDINGS VASCULAR Aorta: Normal caliber aorta without aneurysm, dissection, vasculitis or significant stenosis. Diffuse calcified atheromatous plaque. Celiac: Patent without evidence of aneurysm, dissection, vasculitis or significant stenosis. SMA: Patent without evidence of aneurysm, dissection, vasculitis or significant stenosis. Renals: Mild atheromatous plaque at the origin of the bilateral renal arteries, less than 50% stenotic. Otherwise unremarkable. IMA: Patent without evidence of aneurysm, dissection, vasculitis or significant stenosis. Inflow: Patent without evidence of aneurysm, dissection, vasculitis or significant stenosis. Veins: No obvious venous abnormality within the limitations of this arterial phase study. Review of the MIP images confirms the above findings. NON-VASCULAR Hepatobiliary: Mild diffuse fatty infiltration of the liver. No focal abnormalities. The gallbladder is unremarkable. Pancreas: Unremarkable.  No pancreatic  ductal dilatation or surrounding inflammatory changes. Spleen: Normal in size without focal abnormality. Adrenals/Urinary Tract: There is bilateral renal cortical thinning. Subcentimeter cortical hypodensities bilaterally likely represent cysts. No urinary tract calculi or obstructive uropathy. The bladder is unremarkable. The adrenals are normal. Stomach/Bowel: No bowel obstruction or ileus. Scattered diverticulosis of the sigmoid colon without diverticulitis. No inflammatory changes. Lymphatic: No pathologic adenopathy. Reproductive: Status post hysterectomy. No adnexal masses. Other: No abdominal wall hernia or abnormality. No abdominopelvic ascites. Musculoskeletal: No acute or destructive bony lesions. Reconstructed images demonstrate no additional findings. Review of the MIP images confirms the above findings. IMPRESSION: VASCULAR 1. No evidence of thoracic or abdominal aortic aneurysm. No dissection. NON-VASCULAR 1. No acute process in the chest, abdomen or pelvis. 2. Mild diffuse fatty infiltration of the liver. 3. Sigmoid diverticulosis without diverticulitis. 4. Enlarged heterogeneous and enlarged thyroid gland with a 2.8 cm right lobe nodule. Recommend nonemergent outpatient ultrasound for further characterization. Electronically Signed   By: Randa Ngo M.D.   On: 09/06/2019 02:24    Procedures .Critical Care Performed by: Rolland Porter, MD Authorized by: Rolland Porter, MD   Critical care provider statement:    Critical care time (minutes):  39   Critical care was necessary to treat or prevent imminent or life-threatening deterioration of the following conditions:  Circulatory failure   Critical care was time spent personally by me on the following activities:  Discussions with consultants, examination of patient, obtaining history from patient or surrogate, ordering and review of laboratory studies, ordering and review of radiographic studies, pulse oximetry and re-evaluation of  patient's condition   (including critical care time)  Medications Ordered in ED Medications  0.9 %  sodium chloride infusion (20 mL/hr Intravenous New Bag/Given 09/06/19 0144)  nitroGLYCERIN 50 mg in dextrose 5 % 250 mL (0.2 mg/mL) infusion (20 mcg/min Intravenous Rate/Dose Change 09/06/19 0535)  metoprolol tartrate (LOPRESSOR) injection 5 mg (has no administration in time range)  iohexol (OMNIPAQUE) 350 MG/ML injection 100 mL (100 mLs Intravenous Contrast Given 09/06/19 0203)  potassium chloride SA (KLOR-CON) CR tablet 40 mEq (40 mEq Oral Given 09/06/19 [id number])    ED Course  I have reviewed the triage vital signs and the nursing notes.  Pertinent labs & imaging results that were available during my care of the patient were reviewed by me and considered in my medical decision making (see chart for details).    MDM Rules/Calculators/A&P                     Patient was started on nitroglycerin drip to control her blood pressure and IV Lopressor to improve her heart rate and blood pressure.  Due to her having symptoms it started in her upper back concern was for dissection.  Her EKG now has acute ST depression inferior laterally.  CTA of the chest was ordered and if it is negative I will need to talk to cardiology.  Patient denies having any anterior chest pain.  Patient's last creatinine in October was 0.88 so we proceeded with CTA.  03:28 AM Dr Marletta Lor, cardiology, has reviewed her EKG.  When I talked to the patient she states she feels fine right now.  We discussed giving her oral potassium and repeating her EKG.  Lab is currently getting ready to go in her room and draw her delta troponin.  She feels like the patient does need an echo in the morning. She states depending on the troponins that she may be able to be  discharged home.  Recheck at 5:30 AM patient continues to states she feels fine.  We discussed her delta troponin which the second 1 is elevated.  Her repeat EKG shows the persistent ST  depression in the inferior lateral leads which is new.  We discussed that she will probably need to see the cardiologist in the morning and get an echocardiogram.  At this point patient starts telling me that she had a hysterectomy here 15 to 20 years ago and it because the venting system has air coming out the ceiling it made her have a cold and she was "really sick".  Please note however patient has had a battery-operated handheld fan in her lap the whole time she has been in the ED.  I told her that if she needed to be admitted we would discuss that later.  6:54 AM patient's third troponin is slightly higher at 40. Her echocardiogram has been ordered. Dr. Marletta Lor states she will let cardiology know she needs to be seen in the ED, I will also let the day ED physician know that they need to be contacted.  Patient turned over to Dr. Eulis Foster at change of shift.   Final Clinical Impression(s) / ED Diagnoses Final diagnoses:  Hypokalemia  Subendocardial ischemia ([place])  Troponin level elevated    Rx / DC Orders  Disposition pending  Rolland Porter, MD, Barbette Or, MD 09/06/19 [place], [place], MD 09/06/19 [id number]

## 2019-09-08 DIAGNOSIS — J45909 Unspecified asthma, uncomplicated: Secondary | ICD-10-CM | POA: Diagnosis not present

## 2019-09-08 DIAGNOSIS — I10 Essential (primary) hypertension: Secondary | ICD-10-CM | POA: Diagnosis not present

## 2019-09-08 DIAGNOSIS — Z87891 Personal history of nicotine dependence: Secondary | ICD-10-CM | POA: Diagnosis not present

## 2019-09-08 DIAGNOSIS — E7849 Other hyperlipidemia: Secondary | ICD-10-CM | POA: Diagnosis not present

## 2019-09-19 DIAGNOSIS — E7849 Other hyperlipidemia: Secondary | ICD-10-CM | POA: Diagnosis not present

## 2019-09-19 DIAGNOSIS — Z6824 Body mass index (BMI) 24.0-24.9, adult: Secondary | ICD-10-CM | POA: Diagnosis not present

## 2019-09-19 DIAGNOSIS — I10 Essential (primary) hypertension: Secondary | ICD-10-CM | POA: Diagnosis not present

## 2019-09-19 DIAGNOSIS — R002 Palpitations: Secondary | ICD-10-CM | POA: Diagnosis not present

## 2019-09-19 DIAGNOSIS — R0789 Other chest pain: Secondary | ICD-10-CM | POA: Diagnosis not present

## 2019-10-08 ENCOUNTER — Telehealth: Payer: Self-pay | Admitting: Cardiovascular Disease

## 2019-10-08 ENCOUNTER — Encounter: Payer: Self-pay | Admitting: Cardiovascular Disease

## 2019-10-08 ENCOUNTER — Ambulatory Visit (INDEPENDENT_AMBULATORY_CARE_PROVIDER_SITE_OTHER): Payer: Medicare Other | Admitting: Cardiovascular Disease

## 2019-10-08 ENCOUNTER — Other Ambulatory Visit: Payer: Self-pay

## 2019-10-08 ENCOUNTER — Other Ambulatory Visit (HOSPITAL_COMMUNITY)
Admission: RE | Admit: 2019-10-08 | Discharge: 2019-10-08 | Disposition: A | Discharge status: Home / Self Care | Payer: Medicare Other | Source: Ambulatory Visit | Attending: Cardiovascular Disease | Admitting: Cardiovascular Disease

## 2019-10-08 VITALS — BP 164/84 | HR 64 | Temp 98.2°F | Ht 63.0 in | Wt 142.0 lb

## 2019-10-08 DIAGNOSIS — I10 Essential (primary) hypertension: Secondary | ICD-10-CM

## 2019-10-08 DIAGNOSIS — R079 Chest pain, unspecified: Secondary | ICD-10-CM

## 2019-10-08 DIAGNOSIS — E876 Hypokalemia: Secondary | ICD-10-CM

## 2019-10-08 LAB — BASIC METABOLIC PANEL
Anion gap: 8 (ref 5–15)
BUN: 9 mg/dL (ref 8–23)
CO2: 29 mmol/L (ref 22–32)
Calcium: 9.1 mg/dL (ref 8.9–10.3)
Chloride: 102 mmol/L (ref 98–111)
Creatinine, Ser: 0.87 mg/dL (ref 0.44–1.00)
GFR calc Af Amer: >60 mL/min (ref >60)
GFR calc non Af Amer: >60 mL/min (ref >60)
Glucose, Bld: 103 mg/dL — ABNORMAL HIGH (ref 70–99)
Potassium: 3.7 mmol/L (ref 3.5–5.1)
Sodium: 139 mmol/L (ref 135–145)

## 2019-10-08 NOTE — Telephone Encounter (Signed)

## 2019-10-08 NOTE — Progress Notes (Signed)
CARDIOLOGY CONSULT NOTE  Patient ID: Sandra Reid MRN: [id number] DOB/AGE: 1930/11/04 86 y.o.  Admit date: (Not on file) Primary Physician: Redmond School, MD  Reason for Consultation: Chest pain  HPI: Sandra Reid is a 86 y.o. female who is being seen today for the evaluation of chest pain at the request of Redmond School, MD.   She was evaluated for chest pain and palpitations in the ED on 09/06/2019. I have personally reviewed all documentation, labs, radiographic and cardiovascular studies, and independently interpreted all ECG's.  She was given IV nitroglycerin, IV metoprolol, and potassium.  I recommend that she be treated with amlodipine for hypertension with respect to my discussion with the ED physician.  Systolic blood pressures have been in the 170 range.  High-sensitivity troponins ranged from 28-47.  Lipid panel showed total cholesterol 221 and LDL 145.  Potassium was markedly low at 2.9.  CBC and hemoglobin A1c were normal.  CT angiography of the chest showed no acute process in the chest, abdomen or pelvis.  There was mild diffuse fatty infiltration of the liver.  There was sigmoid diverticulosis without diverticulitis.  The thyroid gland was enlarged.  I personally reviewed the echocardiogram performed on 09/06/2019 which demonstrated vigorous LV systolic function, LVEF 65 to 70%.  There were no regional wall motion abnormalities.  There was grade 1 diastolic dysfunction.  Pulmonary pressures were moderately elevated.  I personally reviewed the ECG which demonstrated sinus rhythm with nonspecific ST segment depression inferiorly and V4 through V6.  She told me the symptoms occurred on the evening of the day she got the first dose of the COVID-19 vaccine.  She has not had any recent blood work since the ED evaluation.  She currently denies chest pain and palpitations.  She developed feet and leg swelling with amlodipine and has not taken it for  several days and this has resolved.   Allergies  Allergen Reactions  . Etodolac     Wheezing/Chest Pain  . Amlodipine     Causing face to be  Flush, dizziness, swelling in feet     Current Outpatient Medications  Medication Sig Dispense Refill  . acetaminophen (TYLENOL) 325 MG tablet Take 325 mg by mouth daily as needed for mild pain or moderate pain. Pain     . ALPRAZolam (XANAX) 1 MG tablet Take 0.25-1 mg by mouth daily.    Marland Kitchen losartan (COZAAR) 100 MG tablet Take 100 mg by mouth every evening.     . montelukast (SINGULAIR) 4 MG chewable tablet Chew 4 mg by mouth daily as needed (wheezing).      No current facility-administered medications for this visit.    Past Medical History:  Diagnosis Date  . Anxiety   . Hypertension     Past Surgical History:  Procedure Laterality Date  . ABDOMINAL HYSTERECTOMY    . APPENDECTOMY    . CATARACT EXTRACTION W/PHACO  03/15/2012   Procedure: CATARACT EXTRACTION PHACO AND INTRAOCULAR LENS PLACEMENT (IOC);  Surgeon: Tonny Branch, MD;  Location: [facility];  Service: Ophthalmology;  Laterality: Left;  CDE:  14.84  . CATARACT EXTRACTION W/PHACO  05/17/2012   Procedure: CATARACT EXTRACTION PHACO AND INTRAOCULAR LENS PLACEMENT (IOC);  Surgeon: Tonny Branch, MD;  Location: [facility];  Service: Ophthalmology;  Laterality: Right;  CDE:13.59  . DILATION AND CURETTAGE OF UTERUS      Social History   Socioeconomic History  . Marital status: Widowed    Spouse name: Not on  file  . Number of children: Not on file  . Years of education: Not on file  . Highest education level: Not on file  Occupational History  . Not on file  Tobacco Use  . Smoking status: Former Smoker    Packs/day: 1.00    Years: 7.00    Pack years: 7.00    Types: Cigarettes  . Smokeless tobacco: Former Systems developer    Quit date: 03/09/1953  Substance and Sexual Activity  . Alcohol use: No  . Drug use: No  . Sexual activity: Yes    Birth control/protection: Surgical  Other Topics Concern  .  Not on file  Social History Narrative  . Not on file   Social Determinants of Health   Financial Resource Strain:   . Difficulty of Paying Living Expenses:   Food Insecurity:   . Worried About Charity fundraiser in the Last Year:   . Arboriculturist in the Last Year:   Transportation Needs:   . Film/video editor (Medical):   Marland Kitchen Lack of Transportation (Non-Medical):   Physical Activity:   . Days of Exercise per Week:   . Minutes of Exercise per Session:   Stress:   . Feeling of Stress :   Social Connections:   . Frequency of Communication with Friends and Family:   . Frequency of Social Gatherings with Friends and Family:   . Attends Religious Services:   . Active Member of Clubs or Organizations:   . Attends Archivist Meetings:   Marland Kitchen Marital Status:   Intimate Partner Violence:   . Fear of Current or Ex-Partner:   . Emotionally Abused:   Marland Kitchen Physically Abused:   . Sexually Abused:      No family history of premature CAD in 1st degree relatives.  Current Meds  Medication Sig  . acetaminophen (TYLENOL) 325 MG tablet Take 325 mg by mouth daily as needed for mild pain or moderate pain. Pain   . ALPRAZolam (XANAX) 1 MG tablet Take 0.25-1 mg by mouth daily.  Marland Kitchen losartan (COZAAR) 100 MG tablet Take 100 mg by mouth every evening.   . montelukast (SINGULAIR) 4 MG chewable tablet Chew 4 mg by mouth daily as needed (wheezing).   . [DISCONTINUED] amLODipine (NORVASC) 5 MG tablet Take 1 tablet (5 mg total) by mouth daily.  . [DISCONTINUED] potassium chloride SA (KLOR-CON) 20 MEQ tablet Take 1 tablet (20 mEq total) by mouth daily.      Review of systems complete and found to be negative unless listed above in HPI   Barbarann Ehlers, RN was present throughout the entirety of the encounter.  Physical exam Blood pressure (!) 164/84, pulse 64, temperature 98.2 F (36.8 C), height 5\' 3"  (1.6 m), SpO2 98 %. General: NAD Neck: No JVD, no thyromegaly or thyroid nodule.    Lungs: Clear to auscultation bilaterally with normal respiratory effort. CV: Nondisplaced PMI. Regular rate and rhythm, normal S1/S2, no S3/S4, no murmur.  No peripheral edema.  No carotid bruit.    Abdomen: Soft, nontender, no distention.  Skin: Intact without lesions or rashes.  Neurologic: Alert and oriented x 3.  Psych: Normal affect. Extremities: No clubbing or cyanosis.  HEENT: Normal.   ECG: Most recent ECG reviewed.   Labs: Lab Results  Component Value Date/Time   K 2.9 (L) 09/06/2019 01:32 AM   BUN 10 09/06/2019 01:32 AM   CREATININE 0.90 09/06/2019 01:32 AM   ALT 11 09/06/2019 01:32 AM  HGB 13.8 09/06/2019 01:32 AM     Lipids: Lab Results  Component Value Date/Time   LDLCALC 145 (H) 09/06/2019 01:32 AM   CHOL 221 (H) 09/06/2019 01:32 AM   TRIG 142 09/06/2019 01:32 AM   HDL 48 09/06/2019 01:32 AM        ASSESSMENT AND PLAN:   1.  Chest pain: This may have been triggered by hypokalemia or could have been an adverse effect of the first dose of the COVID-19 vaccine.  ECG did have abnormalities.  I will proceed with a nuclear myocardial perfusion imaging study to evaluate for ischemic heart disease (Lexiscan Myoview).  2.  Hypertension: Blood pressure remains elevated on losartan 100 mg daily.  Amlodipine led to feet and leg swelling.  This will need further monitoring.  She may need the addition of hydralazine.  3.  Hypokalemia: I will check a basic metabolic panel.    Disposition: Follow up in 3 months virtual visit  Signed: Kate Sable, M.D., F.A.C.C.  10/08/2019, 1:30 PM

## 2019-10-08 NOTE — Patient Instructions (Signed)
Medication Instructions:  Your physician recommends that you continue on your current medications as directed. Please refer to the Current Medication list given to you today.  *If you need a refill on your cardiac medications before your next appointment, please call your pharmacy*   Lab Work: BMET today  If you have labs (blood work) drawn today and your tests are completely normal, you will receive your results only by: Marland Kitchen MyChart Message (if you have MyChart) OR . A paper copy in the mail If you have any lab test that is abnormal or we need to change your treatment, we will call you to review the results.   Testing/Procedures: Your physician has requested that you have a lexiscan myoview. For further information please visit [email or web address]. Please follow instruction sheet, as given.     Follow-Up: At [facility], you and your health needs are our priority.  As part of our continuing mission to provide you with exceptional heart care, we have created designated Provider Care Teams.  These Care Teams include your primary Cardiologist (physician) and Advanced Practice Providers (APPs -  Physician Assistants and Nurse Practitioners) who all work together to provide you with the care you need, when you need it.  We recommend signing up for the patient portal called "MyChart".  Sign up information is provided on this After Visit Summary.  MyChart is used to connect with patients for Virtual Visits (Telemedicine).  Patients are able to view lab/test results, encounter notes, upcoming appointments, etc.  Non-urgent messages can be sent to your provider as well.   To learn more about what you can do with MyChart, go to [email or web address].    Your next appointment:   3 month(s)  The format for your next appointment:   Virtual Visit   Provider:   Kate Sable, MD   Other Instructions None       Thank you for choosing [facility]  !

## 2019-10-09 DIAGNOSIS — I10 Essential (primary) hypertension: Secondary | ICD-10-CM | POA: Diagnosis not present

## 2019-10-09 DIAGNOSIS — J45909 Unspecified asthma, uncomplicated: Secondary | ICD-10-CM | POA: Diagnosis not present

## 2019-10-09 DIAGNOSIS — E7849 Other hyperlipidemia: Secondary | ICD-10-CM | POA: Diagnosis not present

## 2019-10-21 ENCOUNTER — Telehealth: Payer: Self-pay

## 2019-10-21 NOTE — Telephone Encounter (Signed)
Pt cancelled [facility] and has questions for a Nurse. Pt wants to make sure the room for the lexiscan is a cooled room. Tried to encourage Pt to keep [facility] appt, however she insisted on speaking with a Nurse first.  Please call [phone]   Thanks renee

## 2019-10-21 NOTE — Telephone Encounter (Signed)
Pt would stilll like to cancel lexiscan for the time being. She will call once she feels it is necessary. She has lots of issues breathing and worries she will struggle complying with our mask policy. Will forward to front office to cancel.

## 2019-10-23 ENCOUNTER — Encounter (HOSPITAL_COMMUNITY): Payer: Medicare Other

## 2019-10-28 DIAGNOSIS — E7849 Other hyperlipidemia: Secondary | ICD-10-CM | POA: Diagnosis not present

## 2019-10-28 DIAGNOSIS — R7309 Other abnormal glucose: Secondary | ICD-10-CM | POA: Diagnosis not present

## 2019-10-28 DIAGNOSIS — I10 Essential (primary) hypertension: Secondary | ICD-10-CM | POA: Diagnosis not present

## 2019-10-28 DIAGNOSIS — J45909 Unspecified asthma, uncomplicated: Secondary | ICD-10-CM | POA: Diagnosis not present

## 2019-10-28 DIAGNOSIS — Z6824 Body mass index (BMI) 24.0-24.9, adult: Secondary | ICD-10-CM | POA: Diagnosis not present

## 2019-11-08 DIAGNOSIS — I10 Essential (primary) hypertension: Secondary | ICD-10-CM | POA: Diagnosis not present

## 2019-11-08 DIAGNOSIS — E7849 Other hyperlipidemia: Secondary | ICD-10-CM | POA: Diagnosis not present

## 2019-11-08 DIAGNOSIS — J45909 Unspecified asthma, uncomplicated: Secondary | ICD-10-CM | POA: Diagnosis not present

## 2020-01-08 DIAGNOSIS — I10 Essential (primary) hypertension: Secondary | ICD-10-CM | POA: Diagnosis not present

## 2020-01-08 DIAGNOSIS — E7849 Other hyperlipidemia: Secondary | ICD-10-CM | POA: Diagnosis not present

## 2020-01-08 DIAGNOSIS — J45909 Unspecified asthma, uncomplicated: Secondary | ICD-10-CM | POA: Diagnosis not present

## 2020-01-14 ENCOUNTER — Telehealth: Payer: Medicare Other | Admitting: Cardiovascular Disease

## 2020-03-30 DIAGNOSIS — H16223 Keratoconjunctivitis sicca, not specified as Sjogren's, bilateral: Secondary | ICD-10-CM | POA: Diagnosis not present

## 2020-04-01 DIAGNOSIS — Z1389 Encounter for screening for other disorder: Secondary | ICD-10-CM | POA: Diagnosis not present

## 2020-04-01 DIAGNOSIS — Z0001 Encounter for general adult medical examination with abnormal findings: Secondary | ICD-10-CM | POA: Diagnosis not present

## 2020-04-01 DIAGNOSIS — Z6823 Body mass index (BMI) 23.0-23.9, adult: Secondary | ICD-10-CM | POA: Diagnosis not present

## 2020-10-14 DIAGNOSIS — Z6823 Body mass index (BMI) 23.0-23.9, adult: Secondary | ICD-10-CM | POA: Diagnosis not present

## 2020-10-14 DIAGNOSIS — F419 Anxiety disorder, unspecified: Secondary | ICD-10-CM | POA: Diagnosis not present

## 2020-10-14 DIAGNOSIS — H61892 Other specified disorders of left external ear: Secondary | ICD-10-CM | POA: Diagnosis not present

## 2020-10-14 DIAGNOSIS — I872 Venous insufficiency (chronic) (peripheral): Secondary | ICD-10-CM | POA: Diagnosis not present

## 2020-10-14 DIAGNOSIS — I10 Essential (primary) hypertension: Secondary | ICD-10-CM | POA: Diagnosis not present

## 2021-01-08 ENCOUNTER — Ambulatory Visit (HOSPITAL_COMMUNITY)
Admission: RE | Admit: 2021-01-08 | Discharge: 2021-01-08 | Disposition: A | Discharge status: Home / Self Care | Payer: Medicare Other | Source: Ambulatory Visit | Attending: Family Medicine | Admitting: Family Medicine

## 2021-01-08 ENCOUNTER — Other Ambulatory Visit (HOSPITAL_COMMUNITY): Payer: Self-pay | Admitting: Family Medicine

## 2021-01-08 ENCOUNTER — Other Ambulatory Visit: Payer: Self-pay

## 2021-01-08 DIAGNOSIS — M79672 Pain in left foot: Secondary | ICD-10-CM

## 2021-01-08 DIAGNOSIS — Z6823 Body mass index (BMI) 23.0-23.9, adult: Secondary | ICD-10-CM | POA: Diagnosis not present

## 2021-01-08 DIAGNOSIS — H6122 Impacted cerumen, left ear: Secondary | ICD-10-CM | POA: Diagnosis not present

## 2021-01-08 DIAGNOSIS — R600 Localized edema: Secondary | ICD-10-CM | POA: Diagnosis not present

## 2021-01-08 DIAGNOSIS — M7989 Other specified soft tissue disorders: Secondary | ICD-10-CM | POA: Diagnosis not present

## 2021-01-08 DIAGNOSIS — M19072 Primary osteoarthritis, left ankle and foot: Secondary | ICD-10-CM | POA: Diagnosis not present

## 2021-01-08 DIAGNOSIS — R7309 Other abnormal glucose: Secondary | ICD-10-CM | POA: Diagnosis not present

## 2021-01-08 DIAGNOSIS — M7732 Calcaneal spur, left foot: Secondary | ICD-10-CM | POA: Diagnosis not present

## 2021-03-10 DIAGNOSIS — H6123 Impacted cerumen, bilateral: Secondary | ICD-10-CM | POA: Diagnosis not present

## 2021-03-10 DIAGNOSIS — D2322 Other benign neoplasm of skin of left ear and external auricular canal: Secondary | ICD-10-CM | POA: Diagnosis not present

## 2021-03-10 DIAGNOSIS — H903 Sensorineural hearing loss, bilateral: Secondary | ICD-10-CM | POA: Diagnosis not present

## 2021-03-10 DIAGNOSIS — H838X3 Other specified diseases of inner ear, bilateral: Secondary | ICD-10-CM | POA: Diagnosis not present

## 2021-05-03 DIAGNOSIS — F419 Anxiety disorder, unspecified: Secondary | ICD-10-CM | POA: Diagnosis not present

## 2021-05-03 DIAGNOSIS — E663 Overweight: Secondary | ICD-10-CM | POA: Diagnosis not present

## 2021-05-03 DIAGNOSIS — Z6825 Body mass index (BMI) 25.0-25.9, adult: Secondary | ICD-10-CM | POA: Diagnosis not present

## 2021-05-11 DIAGNOSIS — H6122 Impacted cerumen, left ear: Secondary | ICD-10-CM | POA: Diagnosis not present

## 2021-05-11 DIAGNOSIS — D2322 Other benign neoplasm of skin of left ear and external auricular canal: Secondary | ICD-10-CM | POA: Diagnosis not present

## 2021-05-18 IMAGING — DX DG CHEST 1V PORT
1 series · 1 of 1 positions shown · non-contrast
Comparison: 08/25/2017

CLINICAL DATA: Palpitations, chest pain

EXAM:
PORTABLE CHEST 1 VIEW

[chest ap]
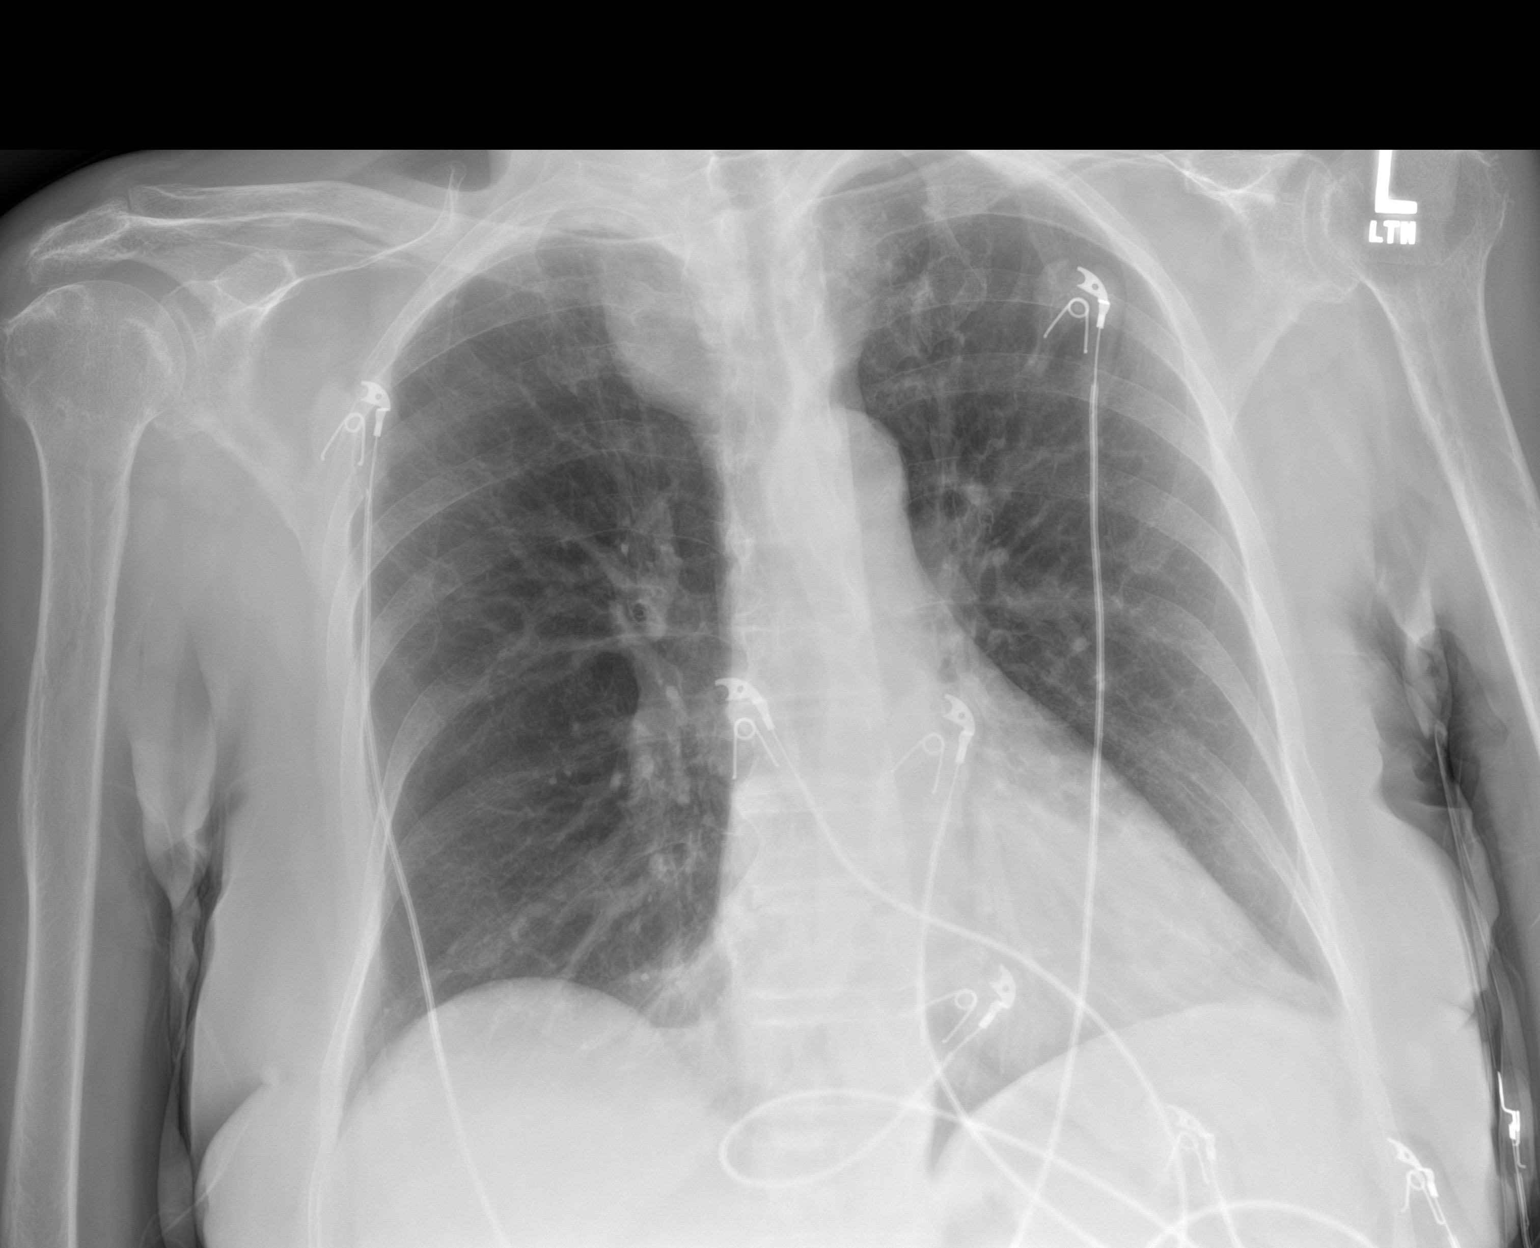

[1 of 1 positions shown; findings below may reference images not displayed]

FINDINGS: Single frontal view of the chest demonstrates a stable cardiac
silhouette. Prominent vascular shadows in the paratracheal regions,
stable. No airspace disease, effusion, or pneumothorax. No acute
bony abnormalities.
IMPRESSION: 1. Stable chest, no acute process.

## 2021-05-18 IMAGING — CT CT ANGIO CHEST-ABD-PELV FOR DISSECTION W/ AND WO/W CM
2 of 7 series · 13 of 46 positions shown, 15 images · IV contrast (Omnipaque or Isovue)
Comparison: 09/06/2019

CLINICAL DATA: Chest pain, interscapular pain, hypertension

EXAM:
CT ANGIOGRAPHY CHEST, ABDOMEN AND PELVIS
TECHNIQUE: Multidetector CT imaging through the chest, abdomen and pelvis was
performed using the standard protocol during bolus administration of
intravenous contrast. Multiplanar reconstructed images and MIPs were
obtained and reviewed to evaluate the vascular anatomy.
CONTRAST:  100mL OMNIPAQUE IOHEXOL 350 MG/ML SOLN

[Series 5: axial arterial · axial · arterial · 0.66mm/px · z∈[+829,+1363]mm · 10 of 208 slices shown, 12 images]
[im 15/208  soft-tissue]
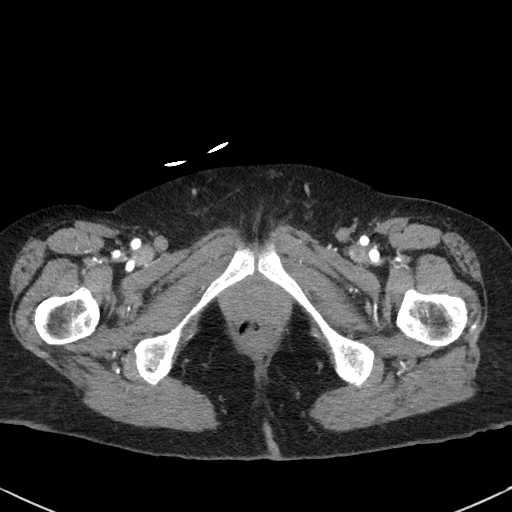
[im 15/208  bone]
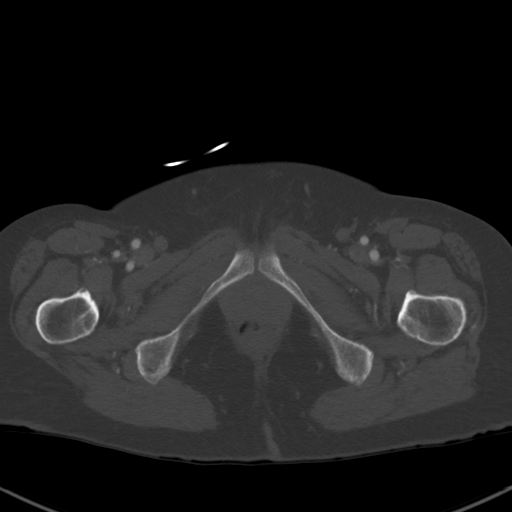
[im 30/208  soft-tissue]
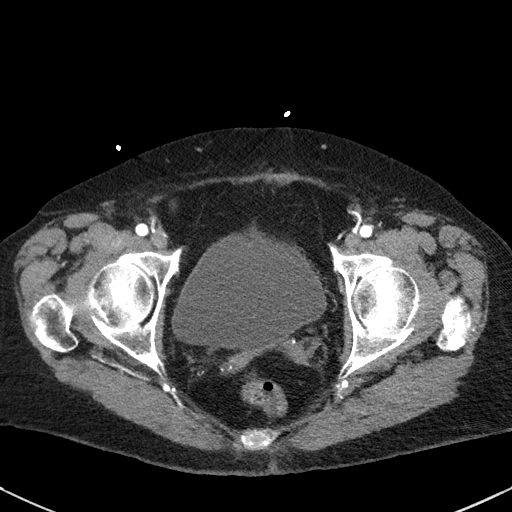
[im 60/208  soft-tissue]
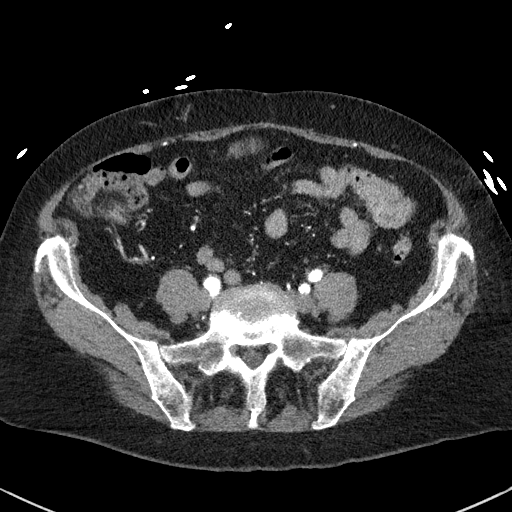
[im 74/208  soft-tissue]
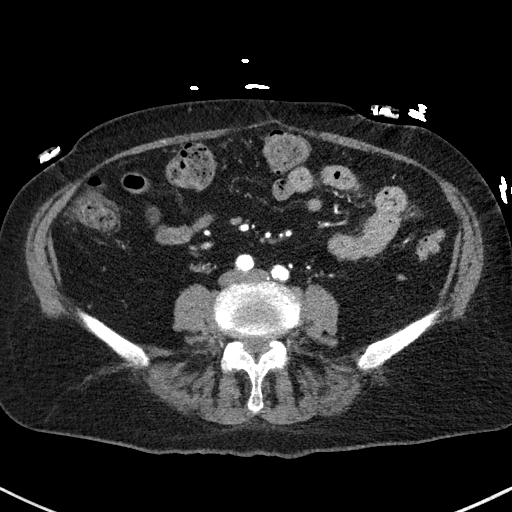
[im 89/208  soft-tissue]
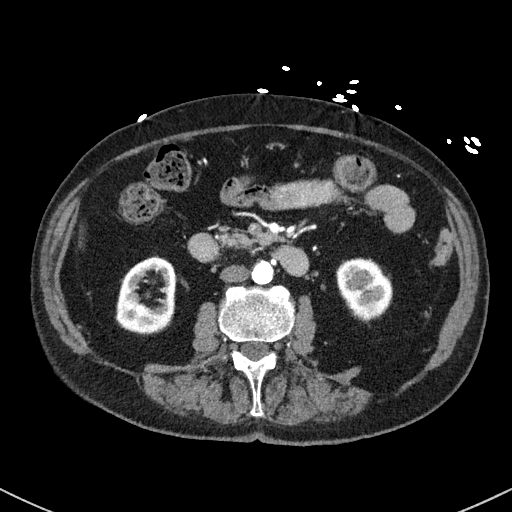
[im 119/208  soft-tissue]
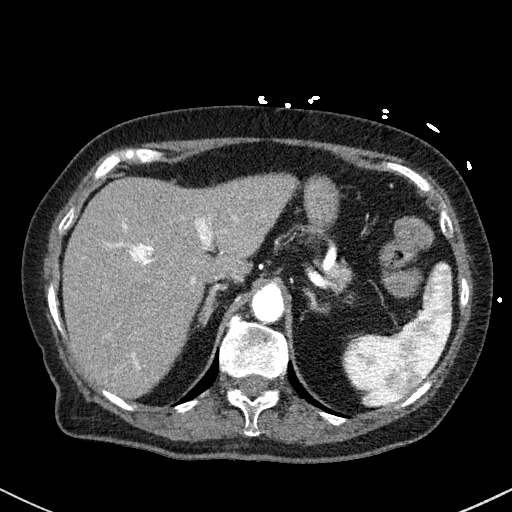
[im 134/208  soft-tissue]
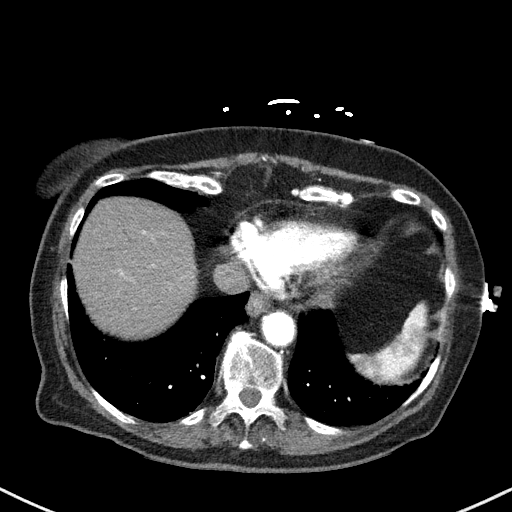
[im 148/208  soft-tissue]
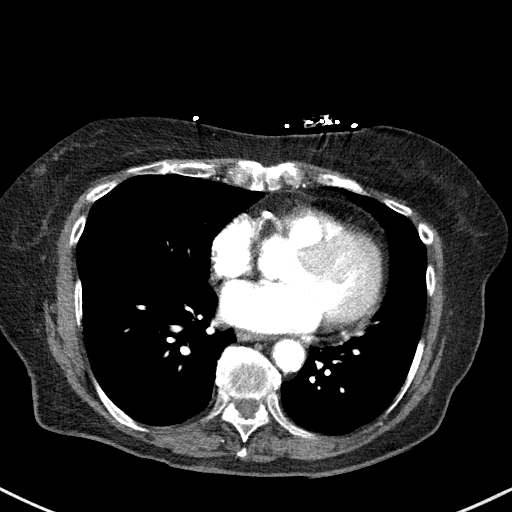
[im 178/208  soft-tissue]
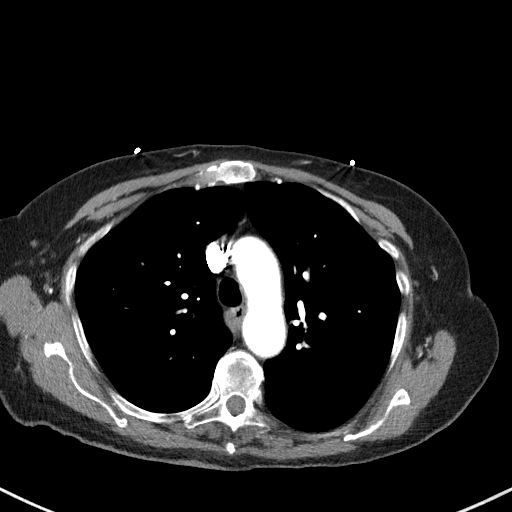
[im 178/208  bone]
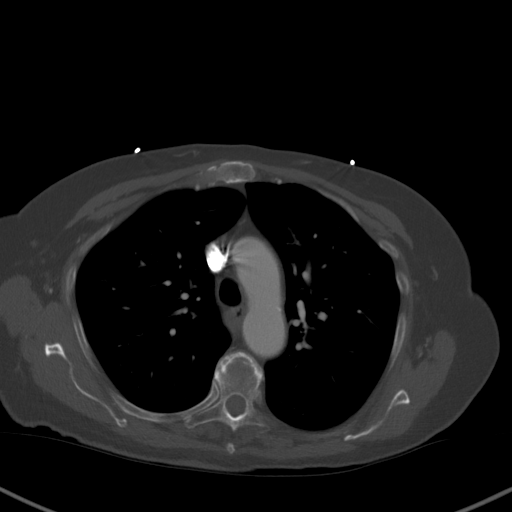
[im 193/208  soft-tissue]
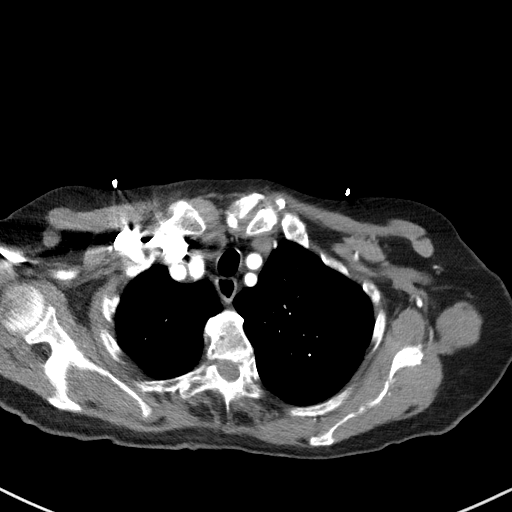

[Series 9: cor soft · coronal · 0.75mm/px · 3 of 119 slices shown]
[im 30/119  soft-tissue]
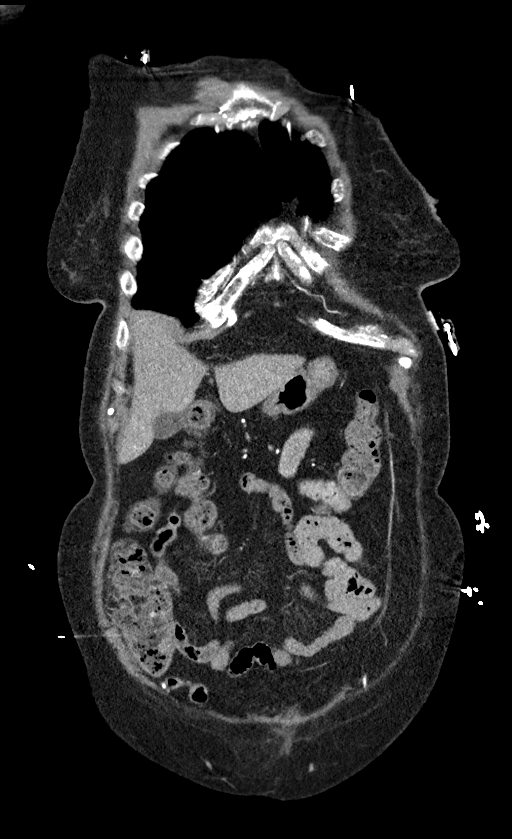
[im 60/119  soft-tissue]
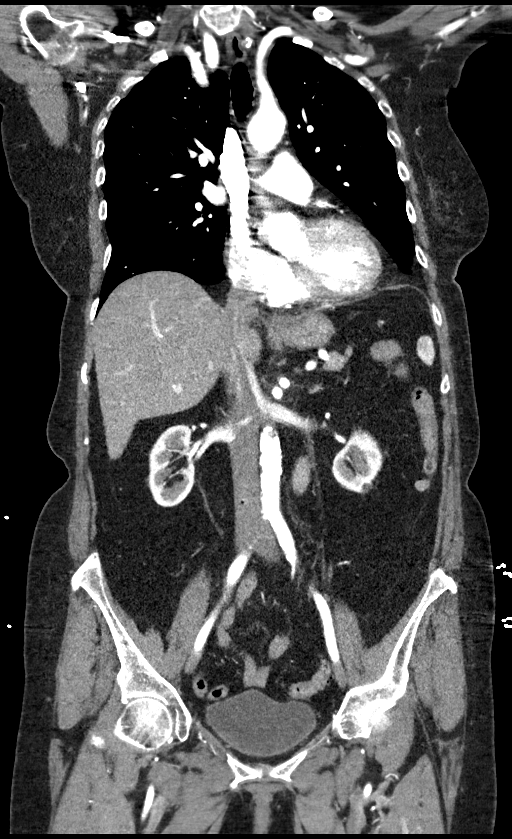
[im 89/119  soft-tissue]
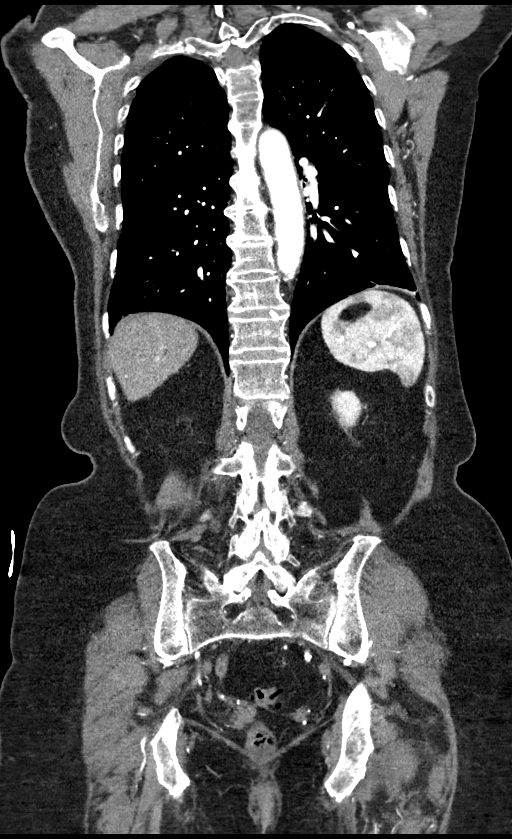

[13 of 46 positions shown; findings below may reference images not displayed]

FINDINGS: CTA CHEST FINDINGS

Cardiovascular: The ascending thoracic aorta measures 3.2 cm in
diameter, aortic arch measures 2.6 cm, and descending thoracic aorta
measures 2.4 cm. No dissection.

The heart is unremarkable without pericardial effusion. While not
optimized for opacification of the pulmonary vasculature, there is
sufficient contrast enhancement to exclude pulmonary emboli.

Mediastinum/Nodes: No enlarged mediastinal, hilar, or axillary lymph
nodes. Thyroid is heterogeneous and enlarged, with a 2.8 x 1.9 cm
right lobe nodule requiring nonemergent outpatient ultrasound for
further characterization. The trachea and esophagus demonstrate no
significant findings.

Lungs/Pleura: Mild background emphysema. No airspace disease,
effusion, or pneumothorax. The central airways are patent.

Musculoskeletal: No acute bony abnormalities.

Review of the MIP images confirms the above findings.

CTA ABDOMEN AND PELVIS FINDINGS

VASCULAR

Aorta: Normal caliber aorta without aneurysm, dissection, vasculitis
or significant stenosis. Diffuse calcified atheromatous plaque.

Celiac: Patent without evidence of aneurysm, dissection, vasculitis
or significant stenosis.

SMA: Patent without evidence of aneurysm, dissection, vasculitis or
significant stenosis.

Renals: Mild atheromatous plaque at the origin of the bilateral
renal arteries, less than 50% stenotic. Otherwise unremarkable.

IMA: Patent without evidence of aneurysm, dissection, vasculitis or
significant stenosis.

Inflow: Patent without evidence of aneurysm, dissection, vasculitis
or significant stenosis.

Veins: No obvious venous abnormality within the limitations of this
arterial phase study.

Review of the MIP images confirms the above findings.

NON-VASCULAR

Hepatobiliary: Mild diffuse fatty infiltration of the liver. No
focal abnormalities. The gallbladder is unremarkable.

Pancreas: Unremarkable. No pancreatic ductal dilatation or
surrounding inflammatory changes.

Spleen: Normal in size without focal abnormality.

Adrenals/Urinary Tract: There is bilateral renal cortical thinning.
Subcentimeter cortical hypodensities bilaterally likely represent
cysts. No urinary tract calculi or obstructive uropathy. The bladder
is unremarkable. The adrenals are normal.

Stomach/Bowel: No bowel obstruction or ileus. Scattered
diverticulosis of the sigmoid colon without diverticulitis. No
inflammatory changes.

Lymphatic: No pathologic adenopathy.

Reproductive: Status post hysterectomy. No adnexal masses.

Other: No abdominal wall hernia or abnormality. No abdominopelvic
ascites.

Musculoskeletal: No acute or destructive bony lesions. Reconstructed
images demonstrate no additional findings.

Review of the MIP images confirms the above findings.
IMPRESSION: VASCULAR

1. No evidence of thoracic or abdominal aortic aneurysm. No
dissection.

NON-VASCULAR

1. No acute process in the chest, abdomen or pelvis.
2. Mild diffuse fatty infiltration of the liver.
3. Sigmoid diverticulosis without diverticulitis.
4. Enlarged heterogeneous and enlarged thyroid gland with a 2.8 cm
right lobe nodule. Recommend nonemergent outpatient ultrasound for
further characterization.

## 2021-06-01 DIAGNOSIS — Z20822 Contact with and (suspected) exposure to covid-19: Secondary | ICD-10-CM | POA: Diagnosis not present

## 2021-09-08 DIAGNOSIS — D2322 Other benign neoplasm of skin of left ear and external auricular canal: Secondary | ICD-10-CM | POA: Diagnosis not present

## 2021-09-08 DIAGNOSIS — H903 Sensorineural hearing loss, bilateral: Secondary | ICD-10-CM | POA: Diagnosis not present

## 2021-12-08 DIAGNOSIS — J45909 Unspecified asthma, uncomplicated: Secondary | ICD-10-CM | POA: Diagnosis not present

## 2021-12-08 DIAGNOSIS — I10 Essential (primary) hypertension: Secondary | ICD-10-CM | POA: Diagnosis not present

## 2021-12-08 DIAGNOSIS — Z0001 Encounter for general adult medical examination with abnormal findings: Secondary | ICD-10-CM | POA: Diagnosis not present

## 2021-12-08 DIAGNOSIS — F419 Anxiety disorder, unspecified: Secondary | ICD-10-CM | POA: Diagnosis not present

## 2021-12-08 DIAGNOSIS — Z6825 Body mass index (BMI) 25.0-25.9, adult: Secondary | ICD-10-CM | POA: Diagnosis not present

## 2021-12-08 DIAGNOSIS — I872 Venous insufficiency (chronic) (peripheral): Secondary | ICD-10-CM | POA: Diagnosis not present

## 2021-12-08 DIAGNOSIS — E559 Vitamin D deficiency, unspecified: Secondary | ICD-10-CM | POA: Diagnosis not present

## 2021-12-08 DIAGNOSIS — Z1331 Encounter for screening for depression: Secondary | ICD-10-CM | POA: Diagnosis not present

## 2021-12-08 DIAGNOSIS — E538 Deficiency of other specified B group vitamins: Secondary | ICD-10-CM | POA: Diagnosis not present

## 2022-01-13 DIAGNOSIS — E538 Deficiency of other specified B group vitamins: Secondary | ICD-10-CM | POA: Diagnosis not present

## 2022-02-14 DIAGNOSIS — E538 Deficiency of other specified B group vitamins: Secondary | ICD-10-CM | POA: Diagnosis not present

## 2022-03-09 DIAGNOSIS — E559 Vitamin D deficiency, unspecified: Secondary | ICD-10-CM | POA: Diagnosis not present

## 2022-03-09 DIAGNOSIS — H04123 Dry eye syndrome of bilateral lacrimal glands: Secondary | ICD-10-CM | POA: Diagnosis not present

## 2022-03-09 DIAGNOSIS — F419 Anxiety disorder, unspecified: Secondary | ICD-10-CM | POA: Diagnosis not present

## 2022-03-09 DIAGNOSIS — T50905A Adverse effect of unspecified drugs, medicaments and biological substances, initial encounter: Secondary | ICD-10-CM | POA: Diagnosis not present

## 2022-03-09 DIAGNOSIS — Z6823 Body mass index (BMI) 23.0-23.9, adult: Secondary | ICD-10-CM | POA: Diagnosis not present

## 2022-03-09 DIAGNOSIS — E538 Deficiency of other specified B group vitamins: Secondary | ICD-10-CM | POA: Diagnosis not present

## 2022-03-09 DIAGNOSIS — I10 Essential (primary) hypertension: Secondary | ICD-10-CM | POA: Diagnosis not present

## 2022-03-23 DIAGNOSIS — E538 Deficiency of other specified B group vitamins: Secondary | ICD-10-CM | POA: Diagnosis not present

## 2022-04-19 DIAGNOSIS — E538 Deficiency of other specified B group vitamins: Secondary | ICD-10-CM | POA: Diagnosis not present

## 2022-06-07 DIAGNOSIS — E538 Deficiency of other specified B group vitamins: Secondary | ICD-10-CM | POA: Diagnosis not present

## 2022-07-07 DIAGNOSIS — E538 Deficiency of other specified B group vitamins: Secondary | ICD-10-CM | POA: Diagnosis not present

## 2022-08-04 DIAGNOSIS — E538 Deficiency of other specified B group vitamins: Secondary | ICD-10-CM | POA: Diagnosis not present

## 2022-09-01 DIAGNOSIS — E538 Deficiency of other specified B group vitamins: Secondary | ICD-10-CM | POA: Diagnosis not present

## 2022-10-06 DIAGNOSIS — E538 Deficiency of other specified B group vitamins: Secondary | ICD-10-CM | POA: Diagnosis not present

## 2022-10-10 DIAGNOSIS — H903 Sensorineural hearing loss, bilateral: Secondary | ICD-10-CM | POA: Diagnosis not present

## 2022-10-10 DIAGNOSIS — H838X3 Other specified diseases of inner ear, bilateral: Secondary | ICD-10-CM | POA: Diagnosis not present

## 2022-10-20 DIAGNOSIS — Z6823 Body mass index (BMI) 23.0-23.9, adult: Secondary | ICD-10-CM | POA: Diagnosis not present

## 2022-10-20 DIAGNOSIS — K056 Periodontal disease, unspecified: Secondary | ICD-10-CM | POA: Diagnosis not present

## 2022-11-08 DIAGNOSIS — E538 Deficiency of other specified B group vitamins: Secondary | ICD-10-CM | POA: Diagnosis not present

## 2022-12-01 DIAGNOSIS — E538 Deficiency of other specified B group vitamins: Secondary | ICD-10-CM | POA: Diagnosis not present

## 2022-12-12 DIAGNOSIS — G9332 Myalgic encephalomyelitis/chronic fatigue syndrome: Secondary | ICD-10-CM | POA: Diagnosis not present

## 2022-12-12 DIAGNOSIS — J45909 Unspecified asthma, uncomplicated: Secondary | ICD-10-CM | POA: Diagnosis not present

## 2022-12-12 DIAGNOSIS — F419 Anxiety disorder, unspecified: Secondary | ICD-10-CM | POA: Diagnosis not present

## 2022-12-12 DIAGNOSIS — Z0001 Encounter for general adult medical examination with abnormal findings: Secondary | ICD-10-CM | POA: Diagnosis not present

## 2022-12-12 DIAGNOSIS — Z1331 Encounter for screening for depression: Secondary | ICD-10-CM | POA: Diagnosis not present

## 2022-12-12 DIAGNOSIS — I10 Essential (primary) hypertension: Secondary | ICD-10-CM | POA: Diagnosis not present

## 2022-12-12 DIAGNOSIS — H04123 Dry eye syndrome of bilateral lacrimal glands: Secondary | ICD-10-CM | POA: Diagnosis not present

## 2022-12-12 DIAGNOSIS — E559 Vitamin D deficiency, unspecified: Secondary | ICD-10-CM | POA: Diagnosis not present

## 2022-12-12 DIAGNOSIS — D518 Other vitamin B12 deficiency anemias: Secondary | ICD-10-CM | POA: Diagnosis not present

## 2022-12-12 DIAGNOSIS — E785 Hyperlipidemia, unspecified: Secondary | ICD-10-CM | POA: Diagnosis not present

## 2022-12-12 DIAGNOSIS — Z6823 Body mass index (BMI) 23.0-23.9, adult: Secondary | ICD-10-CM | POA: Diagnosis not present

## 2022-12-12 DIAGNOSIS — E538 Deficiency of other specified B group vitamins: Secondary | ICD-10-CM | POA: Diagnosis not present

## 2023-01-31 ENCOUNTER — Emergency Department (HOSPITAL_COMMUNITY)
Admission: EM | Admit: 2023-01-31 | Discharge: 2023-01-31 | Disposition: A | Discharge status: Home / Self Care | Payer: Medicare Other | Attending: Emergency Medicine | Admitting: Emergency Medicine

## 2023-01-31 ENCOUNTER — Other Ambulatory Visit: Payer: Self-pay

## 2023-01-31 DIAGNOSIS — T7840XA Allergy, unspecified, initial encounter: Secondary | ICD-10-CM | POA: Diagnosis present

## 2023-01-31 DIAGNOSIS — I10 Essential (primary) hypertension: Secondary | ICD-10-CM | POA: Insufficient documentation

## 2023-01-31 DIAGNOSIS — R21 Rash and other nonspecific skin eruption: Secondary | ICD-10-CM | POA: Diagnosis not present

## 2023-01-31 DIAGNOSIS — Z87891 Personal history of nicotine dependence: Secondary | ICD-10-CM | POA: Diagnosis not present

## 2023-01-31 MED ORDER — DIPHENHYDRAMINE HCL 25 MG PO CAPS
25.0000 mg | ORAL_CAPSULE | Freq: Once | ORAL | Status: AC
  Administered 2023-01-31: 25 mg via ORAL
  Filled 2023-01-31: qty 1

## 2023-01-31 MED ORDER — PREDNISONE 10 MG PO TABS: ORAL_TABLET | ORAL | 0 refills | Status: AC

## 2023-01-31 MED ORDER — PREDNISONE 50 MG PO TABS
60.0000 mg | ORAL_TABLET | Freq: Once | ORAL | Status: AC
  Administered 2023-01-31: 60 mg via ORAL
  Filled 2023-01-31: qty 1

## 2023-01-31 MED ORDER — FAMOTIDINE 20 MG PO TABS
20.0000 mg | ORAL_TABLET | Freq: Once | ORAL | Status: AC
  Administered 2023-01-31: 20 mg via ORAL
  Filled 2023-01-31: qty 1

## 2023-01-31 MED ORDER — FAMOTIDINE 20 MG PO TABS: 20.0000 mg | ORAL_TABLET | Freq: Two times a day (BID) | ORAL | 0 refills | Status: AC

## 2023-01-31 MED ORDER — DIPHENHYDRAMINE HCL 25 MG PO TABS: 25.0000 mg | ORAL_TABLET | Freq: Four times a day (QID) | ORAL | 0 refills | Status: AC

## 2023-01-31 NOTE — ED Provider Notes (Signed)
[facility] Emergency Department Provider Note MRN:  [id number]  Arrival date & time: 01/31/23     Chief Complaint   Allergic Reaction   History of Present Illness   Sandra Reid is a 89 y.o. year-old female with no pertinent past medical history presenting to the ED with chief complaint of allergic reaction.  Patient explains that yesterday she was cleaning some old furniture in a garage with a damp cloth and since then has been developing red rash to the arms, legs, torso.  No shortness of breath, no other complaints.  Review of Systems  A thorough review of systems was obtained and all systems are negative except as noted in the HPI and PMH.   Patient's Health History    Past Medical History:  Diagnosis Date   Anxiety    Hypertension     Past Surgical History:  Procedure Laterality Date   ABDOMINAL HYSTERECTOMY     APPENDECTOMY     CATARACT EXTRACTION W/PHACO  03/15/2012   Procedure: CATARACT EXTRACTION PHACO AND INTRAOCULAR LENS PLACEMENT (IOC);  Surgeon: Gemma Payor, MD;  Location: [facility];  Service: Ophthalmology;  Laterality: Left;  CDE:  14.84   CATARACT EXTRACTION W/PHACO  05/17/2012   Procedure: CATARACT EXTRACTION PHACO AND INTRAOCULAR LENS PLACEMENT (IOC);  Surgeon: Gemma Payor, MD;  Location: [facility];  Service: Ophthalmology;  Laterality: Right;  CDE:13.59   DILATION AND CURETTAGE OF UTERUS      Family History  Problem Relation Age of Onset   Heart disease Sister    Hypertension Sister    Hypertension Sister     Social History   Socioeconomic History   Marital status: Widowed    Spouse name: Not on file   Number of children: Not on file   Years of education: Not on file   Highest education level: Not on file  Occupational History   Not on file  Tobacco Use   Smoking status: Former    Current packs/day: 1.00    Average packs/day: 1 pack/day for 7.0 years (7.0 ttl pk-yrs)    Types: Cigarettes   Smokeless tobacco: Former    Quit  date: 03/09/1953  Vaping Use   Vaping status: Never Used  Substance and Sexual Activity   Alcohol use: No   Drug use: No   Sexual activity: Yes    Birth control/protection: Surgical  Other Topics Concern   Not on file  Social History Narrative   Not on file   Social Determinants of Health   Financial Resource Strain: Not on file  Food Insecurity: Not on file  Transportation Needs: Not on file  Physical Activity: Not on file  Stress: Not on file  Social Connections: Not on file  Intimate Partner Violence: Not on file     Physical Exam   Vitals:   01/31/23 0347  BP: (!) 209/80  Pulse: 69  Resp: 19  Temp: 97.8 F (36.6 C)  SpO2: 98%    CONSTITUTIONAL: Well-appearing, NAD, moderately anxious NEURO/PSYCH:  Alert and oriented x 3, no focal deficits EYES:  eyes equal and reactive ENT/NECK:  no LAD, no JVD CARDIO: Regular rate, well-perfused, normal S1 and S2 PULM:  CTAB no wheezing or rhonchi GI/GU:  non-distended, non-tender MSK/SPINE:  No gross deformities, no edema SKIN: Macular erythematous rash to the arms, legs, torso, nose   *Additional and/or pertinent findings included in MDM below  Diagnostic and Interventional Summary    EKG Interpretation Date/Time:    Ventricular Rate:  PR Interval:    QRS Duration:    QT Interval:    QTC Calculation:   R Axis:      Text Interpretation:         Labs Reviewed - No data to display  No orders to display    Medications  predniSONE (DELTASONE) tablet 60 mg (has no administration in time range)  diphenhydrAMINE (BENADRYL) capsule 25 mg (has no administration in time range)  famotidine (PEPCID) tablet 20 mg (has no administration in time range)     Procedures  /  Critical Care Procedures  ED Course and Medical Decision Making  Initial Impression and Ddx Suspect allergic reaction or contact dermatitis, possibly from old furniture varnish or paint.  No concerning symptoms to suggest angioedema or  anaphylaxis.  Patient is hypertensive on arrival but quite anxious.  Seems to be coming down with reassurance.  Past medical/surgical history that increases complexity of ED encounter: Hypertension  Interpretation of Diagnostics Laboratory and/or imaging options to aid in the diagnosis/care of the patient were considered.  After careful history and physical examination, it was determined that there was no indication for diagnostics at this time.  Patient Reassessment and Ultimate Disposition/Management     Discharge  Patient management required discussion with the following services or consulting groups:  None  Complexity of Problems Addressed Acute complicated illness or Injury  Additional Data Reviewed and Analyzed Further history obtained from: None  Additional Factors Impacting ED Encounter Risk Prescriptions  Elmer Sow. Pilar Plate, MD [facility] [email or web address]  Final Clinical Impressions(s) / ED Diagnoses     ICD-10-CM   1. Rash  R21       ED Discharge Orders          Ordered    predniSONE (DELTASONE) 10 MG tablet        01/31/23 0358    diphenhydrAMINE (BENADRYL) 25 MG tablet  Every 6 hours        01/31/23 0358    famotidine (PEPCID) 20 MG tablet  2 times daily        01/31/23 0358             Discharge Instructions Discussed with and Provided to Patient:    Discharge Instructions      You were evaluated in the Emergency Department and after careful evaluation, we did not find any emergent condition requiring admission or further testing in the hospital.  Your exam/testing today was overall reassuring.  Symptoms likely due to an allergic reaction or a contact dermatitis.  Take the prednisone medication as directed, take your first dose morning of 7-24.  The dose of the medicine will slowly go down.  This is called a taper.  You can use the Benadryl every 4-6 hours as needed for rash or itch.  You can also  use the Pepcid every 12 hours as needed for rash or itch.  Please return to the Emergency Department if you experience any worsening of your condition.  Thank you for allowing [place] to be a part of your care.       Sabas Sous, MD 01/31/23 [phone]

## 2023-01-31 NOTE — Discharge Instructions (Signed)
You were evaluated in the Emergency Department and after careful evaluation, we did not find any emergent condition requiring admission or further testing in the hospital.  Your exam/testing today was overall reassuring.  Symptoms likely due to an allergic reaction or a contact dermatitis.  Take the prednisone medication as directed, take your first dose morning of 7-24.  The dose of the medicine will slowly go down.  This is called a taper.  You can use the Benadryl every 4-6 hours as needed for rash or itch.  You can also use the Pepcid every 12 hours as needed for rash or itch.  Please return to the Emergency Department if you experience any worsening of your condition.  Thank you for allowing [place] to be a part of your care.

## 2023-01-31 NOTE — ED Triage Notes (Addendum)
Pt states she was cleaning some furniture last night when she felt a burning sensation on her arms. Pt now has rash on her arms, neck, chest and legs that is hot to the touch. Denies any environmental allergies. Denies any breathing difficulty

## 2023-01-31 NOTE — ED Notes (Signed)
Pt denies SOB, trouble swallowing, throat swelling, and NARD. Ambulatory on discharge.

## 2023-05-25 DIAGNOSIS — K130 Diseases of lips: Secondary | ICD-10-CM | POA: Diagnosis not present

## 2023-05-25 DIAGNOSIS — Z6823 Body mass index (BMI) 23.0-23.9, adult: Secondary | ICD-10-CM | POA: Diagnosis not present

## 2023-05-25 DIAGNOSIS — F419 Anxiety disorder, unspecified: Secondary | ICD-10-CM | POA: Diagnosis not present

## 2023-05-25 DIAGNOSIS — J45909 Unspecified asthma, uncomplicated: Secondary | ICD-10-CM | POA: Diagnosis not present

## 2023-05-25 DIAGNOSIS — I872 Venous insufficiency (chronic) (peripheral): Secondary | ICD-10-CM | POA: Diagnosis not present

## 2023-05-25 DIAGNOSIS — I10 Essential (primary) hypertension: Secondary | ICD-10-CM | POA: Diagnosis not present

## 2023-06-17 ENCOUNTER — Ambulatory Visit
Admission: EM | Admit: 2023-06-17 | Discharge: 2023-06-17 | Disposition: A | Discharge status: Home / Self Care | Payer: Medicare Other | Attending: Nurse Practitioner | Admitting: Nurse Practitioner

## 2023-06-17 DIAGNOSIS — K130 Diseases of lips: Secondary | ICD-10-CM | POA: Diagnosis not present

## 2023-06-17 MED ORDER — MUPIROCIN 2 % EX OINT: 1.0000 | TOPICAL_OINTMENT | Freq: Two times a day (BID) | CUTANEOUS | 0 refills | Status: AC

## 2023-06-17 MED ORDER — CEPHALEXIN 500 MG PO CAPS: 500.0000 mg | ORAL_CAPSULE | Freq: Three times a day (TID) | ORAL | 0 refills | Status: AC

## 2023-06-17 NOTE — Discharge Instructions (Addendum)
Take medication as prescribed. May take over-the-counter Tylenol as needed for pain, fever, or general discomfort. Make sure you are eating a diet that is rich in protein, iron, and vitamins. May use lip balm or petroleum jelly to help keep the mouth moisturized and protected. If symptoms do not improve with this treatment, please follow-up with your primary care physician for further evaluation. Follow-up as needed.

## 2023-06-17 NOTE — ED Triage Notes (Signed)
Pt reports she has mouth sores x 2 months

## 2023-06-17 NOTE — ED Provider Notes (Signed)
[facility]    CSN: [id number] Arrival date & time: 06/17/23  1559      History   Chief Complaint No chief complaint on file.   HPI Sandra Reid is a 89 y.o. female.   The history is provided by the patient.   Patient presents for complaints of mouth sores that been present for the past 2 months.  Patient states that she has seen her PCP who informed her this was a fungus.  Patient was prescribed clotrimazole cream and Diflucan.  Patient states that she has not noticed any relief of her symptoms.  She states that the skin around the corners of her mouth has continued to crack and peel.  She also states the areas of her mouth are sore.  Patient states that she has not had any fever, chills, chest pain, abdominal pain, dental pain, recent dental work, or use of tobacco.  Patient states that she is concerned because the areas are not improving, and appear to be worsening at this time.  Past Medical History:  Diagnosis Date   Anxiety    Hypertension     Patient Active Problem List   Diagnosis Date Noted   PHLEBITIS, SUPERFICIAL LEG VEINS 10/14/2008   BRONCHITIS 10/14/2008   HYPERLIPIDEMIA 10/13/2008   HYPERTENSION 10/13/2008    Past Surgical History:  Procedure Laterality Date   ABDOMINAL HYSTERECTOMY     APPENDECTOMY     CATARACT EXTRACTION W/PHACO  03/15/2012   Procedure: CATARACT EXTRACTION PHACO AND INTRAOCULAR LENS PLACEMENT (IOC);  Surgeon: Gemma Payor, MD;  Location: [facility];  Service: Ophthalmology;  Laterality: Left;  CDE:  14.84   CATARACT EXTRACTION W/PHACO  05/17/2012   Procedure: CATARACT EXTRACTION PHACO AND INTRAOCULAR LENS PLACEMENT (IOC);  Surgeon: Gemma Payor, MD;  Location: [facility];  Service: Ophthalmology;  Laterality: Right;  CDE:13.59   DILATION AND CURETTAGE OF UTERUS      OB History   No obstetric history on file.      Home Medications    Prior to Admission medications   Medication Sig Start Date End Date Taking? Authorizing Provider   cephALEXin (KEFLEX) 500 MG capsule Take 1 capsule (500 mg total) by mouth 3 (three) times daily for 5 days. 06/17/23 06/22/23 Yes Leath-Warren, Sadie Haber, NP  mupirocin ointment (BACTROBAN) 2 % Apply 1 Application topically 2 (two) times daily. 06/17/23  Yes Leath-Warren, Sadie Haber, NP  acetaminophen (TYLENOL) 325 MG tablet Take 325 mg by mouth daily as needed for mild pain or moderate pain. Pain     [provider]  ALPRAZolam Prudy Feeler) 1 MG tablet Take 0.25-1 mg by mouth daily.    [provider]  diphenhydrAMINE (BENADRYL) 25 MG tablet Take 1 tablet (25 mg total) by mouth every 6 (six) hours. 01/31/23   Sabas Sous, MD  famotidine (PEPCID) 20 MG tablet Take 1 tablet (20 mg total) by mouth 2 (two) times daily. 01/31/23   Sabas Sous, MD  losartan (COZAAR) 100 MG tablet Take 100 mg by mouth every evening.  03/28/17   [provider]  montelukast (SINGULAIR) 4 MG chewable tablet Chew 4 mg by mouth daily as needed (wheezing).     [provider]  predniSONE (DELTASONE) 10 MG tablet Take 4 tablets once a day for 3 days. Take 3 tablets once a day for the next 3 days. Take 2 tablets once a day for the next 3 days. Take 1 tablet once a day for the next 3 days.  01/31/23   Sabas Sous, MD    Family History Family History  Problem Relation Age of Onset   Heart disease Sister    Hypertension Sister    Hypertension Sister     Social History Social History   Tobacco Use   Smoking status: Former    Current packs/day: 1.00    Average packs/day: 1 pack/day for 7.0 years (7.0 ttl pk-yrs)    Types: Cigarettes   Smokeless tobacco: Former    Quit date: 03/09/1953  Vaping Use   Vaping status: Never Used  Substance Use Topics   Alcohol use: No   Drug use: No     Allergies   Etodolac and Amlodipine   Review of Systems Review of Systems Per HPI  Physical Exam Triage Vital Signs ED Triage Vitals [06/17/23 1603]  Encounter Vitals Group     BP (!)  178/89     Systolic BP Percentile      Diastolic BP Percentile      Pulse Rate 83     Resp 16     Temp (!) 97.1 F (36.2 C)     Temp Source Oral     SpO2 98 %     Weight      Height      Head Circumference      Peak Flow      Pain Score 0     Pain Loc      Pain Education      Exclude from Growth Chart    No data found.  Updated Vital Signs BP (!) 178/89 (BP Location: Right Arm)   Pulse 83   Temp (!) 97.1 F (36.2 C) (Oral)   Resp 16   SpO2 98%   Visual Acuity Right Eye Distance:   Left Eye Distance:   Bilateral Distance:    Right Eye Near:   Left Eye Near:    Bilateral Near:     Physical Exam Vitals and nursing note reviewed.  Constitutional:      Appearance: Normal appearance.  HENT:     Head: Normocephalic.     Mouth/Throat:     Lips: Pink.     Mouth: Oral lesions present.     Comments: Maceration, fissuring, and crusting noted to the corners of the mouth bilaterally.  Areas are erythematous.  There is no oozing, fluctuance, or drainage present. Eyes:     Extraocular Movements: Extraocular movements intact.     Pupils: Pupils are equal, round, and reactive to light.  Pulmonary:     Effort: Pulmonary effort is normal.  Skin:    General: Skin is warm and dry.  Neurological:     General: No focal deficit present.     Mental Status: She is alert and oriented to person, place, and time.  Psychiatric:        Mood and Affect: Mood normal.        Behavior: Behavior normal.      UC Treatments / Results  Labs (all labs ordered are listed, but only abnormal results are displayed) Labs Reviewed - No data to display  EKG   Radiology No results found.  Procedures Procedures (including critical care time)  Medications Ordered in UC Medications - No data to display  Initial Impression / Assessment and Plan / UC Course  I have reviewed the triage vital signs and the nursing notes.  Pertinent labs & imaging results that were available during my care  of the patient were reviewed  by me and considered in my medical decision making (see chart for details).  Symptoms consistent with angular cheilitis.  Will treat with mupirocin 2% ointment, and given duration of patient's symptoms, will prescribe short trial of Keflex 500 mg 3 times daily for 5 days.  Supportive care recommendations were provided and discussed with the patient to include dietary modifications, over-the-counter analgesics, and keeping the areas of the mouth moist and protected.  Patient was advised to follow-up with her PCP if symptoms not improved with this treatment.  Patient was in agreement with this plan of care and verbalized understanding.  All questions were answered.  Patient stable for discharge.  Final Clinical Impressions(s) / UC Diagnoses   Final diagnoses:  Cheilitis     Discharge Instructions      Take medication as prescribed. May take over-the-counter Tylenol as needed for pain, fever, or general discomfort. Make sure you are eating a diet that is rich in protein, iron, and vitamins. May use lip balm or petroleum jelly to help keep the mouth moisturized and protected. If symptoms do not improve with this treatment, please follow-up with your primary care physician for further evaluation. Follow-up as needed.    ED Prescriptions     Medication Sig Dispense Auth. Provider   mupirocin ointment (BACTROBAN) 2 % Apply 1 Application topically 2 (two) times daily. 30 g Leath-Warren, Sadie Haber, NP   cephALEXin (KEFLEX) 500 MG capsule Take 1 capsule (500 mg total) by mouth 3 (three) times daily for 5 days. 15 capsule Leath-Warren, Sadie Haber, NP      PDMP not reviewed this encounter.   Abran Cantor, NP 06/18/23 0825

## 2023-06-26 DIAGNOSIS — J0100 Acute maxillary sinusitis, unspecified: Secondary | ICD-10-CM | POA: Diagnosis not present

## 2023-06-26 DIAGNOSIS — I10 Essential (primary) hypertension: Secondary | ICD-10-CM | POA: Diagnosis not present

## 2023-06-26 DIAGNOSIS — K130 Diseases of lips: Secondary | ICD-10-CM | POA: Diagnosis not present

## 2023-06-26 DIAGNOSIS — Z6823 Body mass index (BMI) 23.0-23.9, adult: Secondary | ICD-10-CM | POA: Diagnosis not present

## 2023-08-10 DIAGNOSIS — H04123 Dry eye syndrome of bilateral lacrimal glands: Secondary | ICD-10-CM | POA: Diagnosis not present

## 2023-08-19 ENCOUNTER — Ambulatory Visit
Admission: EM | Admit: 2023-08-19 | Discharge: 2023-08-19 | Disposition: A | Discharge status: Home / Self Care | Payer: Medicare Other | Attending: Family Medicine | Admitting: Family Medicine

## 2023-08-19 DIAGNOSIS — J208 Acute bronchitis due to other specified organisms: Secondary | ICD-10-CM | POA: Diagnosis not present

## 2023-08-19 LAB — POC COVID19/FLU A&B COMBO
Covid Antigen, POC: NEGATIVE
Influenza A Antigen, POC: NEGATIVE
Influenza B Antigen, POC: NEGATIVE

## 2023-08-19 MED ORDER — DEXAMETHASONE SODIUM PHOSPHATE 10 MG/ML IJ SOLN
10.0000 mg | Freq: Once | INTRAMUSCULAR | Status: AC
  Administered 2023-08-19: 10 mg via INTRAMUSCULAR

## 2023-08-19 NOTE — ED Triage Notes (Signed)
 Pt reports she has  a bad cough , body aches x 1 week   Took robitussin and dayquil

## 2023-08-19 NOTE — ED Provider Notes (Signed)
 [facility]    CSN: [id number] Arrival date & time: 08/19/23  1305      History   Chief Complaint No chief complaint on file.   HPI Sandra Reid is a 89 y.o. female.   Patient presenting today with 1 week history of cough, body aches, fever several days ago that has now resolved.  States now mainly just a hacking constant cough that is keeping her up at night.  Denies chest pain, shortness of breath, wheezing, abdominal pain, nausea vomiting or diarrhea.  Taking over-the-counter cold and congestion medication with minimal relief.  Has had a history of bronchitis in the past.    Past Medical History:  Diagnosis Date   Anxiety    Hypertension     Patient Active Problem List   Diagnosis Date Noted   PHLEBITIS, SUPERFICIAL LEG VEINS 10/14/2008   BRONCHITIS 10/14/2008   HYPERLIPIDEMIA 10/13/2008   HYPERTENSION 10/13/2008    Past Surgical History:  Procedure Laterality Date   ABDOMINAL HYSTERECTOMY     APPENDECTOMY     CATARACT EXTRACTION W/PHACO  03/15/2012   Procedure: CATARACT EXTRACTION PHACO AND INTRAOCULAR LENS PLACEMENT (IOC);  Surgeon: Cherene Mania, MD;  Location: [facility];  Service: Ophthalmology;  Laterality: Left;  CDE:  14.84   CATARACT EXTRACTION W/PHACO  05/17/2012   Procedure: CATARACT EXTRACTION PHACO AND INTRAOCULAR LENS PLACEMENT (IOC);  Surgeon: Cherene Mania, MD;  Location: [facility];  Service: Ophthalmology;  Laterality: Right;  CDE:13.59   DILATION AND CURETTAGE OF UTERUS      OB History   No obstetric history on file.      Home Medications    Prior to Admission medications   Medication Sig Start Date End Date Taking? Authorizing Provider  acetaminophen (TYLENOL) 325 MG tablet Take 325 mg by mouth daily as needed for mild pain or moderate pain. Pain     [provider]  ALPRAZolam (XANAX) 1 MG tablet Take 0.25-1 mg by mouth daily.    [provider]  diphenhydrAMINE  (BENADRYL ) 25 MG tablet Take 1 tablet (25 mg total) by mouth  every 6 (six) hours. 01/31/23   Theadore Ozell HERO, MD  famotidine  (PEPCID ) 20 MG tablet Take 1 tablet (20 mg total) by mouth 2 (two) times daily. 01/31/23   Theadore Ozell HERO, MD  losartan  (COZAAR ) 100 MG tablet Take 100 mg by mouth every evening.  03/28/17   [provider]  montelukast (SINGULAIR) 4 MG chewable tablet Chew 4 mg by mouth daily as needed (wheezing).     [provider]  mupirocin  ointment (BACTROBAN ) 2 % Apply 1 Application topically 2 (two) times daily. 06/17/23   Leath-Warren, Etta PARAS, NP  predniSONE  (DELTASONE ) 10 MG tablet Take 4 tablets once a day for 3 days. Take 3 tablets once a day for the next 3 days. Take 2 tablets once a day for the next 3 days. Take 1 tablet once a day for the next 3 days. 01/31/23   Theadore Ozell HERO, MD    Family History Family History  Problem Relation Age of Onset   Heart disease Sister    Hypertension Sister    Hypertension Sister     Social History Social History   Tobacco Use   Smoking status: Former    Current packs/day: 1.00    Average packs/day: 1 pack/day for 7.0 years (7.0 ttl pk-yrs)    Types: Cigarettes   Smokeless tobacco: Former    Quit date: 03/09/1953  Vaping Use  Vaping status: Never Used  Substance Use Topics   Alcohol use: No   Drug use: No     Allergies   Etodolac and Amlodipine    Review of Systems Review of Systems Per HPI  Physical Exam Triage Vital Signs ED Triage Vitals  Encounter Vitals Group     BP 08/19/23 1433 (!) 165/74     Systolic BP Percentile --      Diastolic BP Percentile --      Pulse Rate 08/19/23 1433 (!) 54     Resp 08/19/23 1433 18     Temp 08/19/23 1433 98 F (36.7 C)     Temp Source 08/19/23 1433 Oral     SpO2 08/19/23 1433 97 %     Weight --      Height --      Head Circumference --      Peak Flow --      Pain Score 08/19/23 1432 0     Pain Loc --      Pain Education --      Exclude from Growth Chart --    No data found.  Updated Vital Signs BP (!)  165/74 (BP Location: Right Arm)   Pulse (!) 54   Temp 98 F (36.7 C) (Oral)   Resp 18   SpO2 97%   Visual Acuity Right Eye Distance:   Left Eye Distance:   Bilateral Distance:    Right Eye Near:   Left Eye Near:    Bilateral Near:     Physical Exam Vitals and nursing note reviewed.  Constitutional:      Appearance: Normal appearance.  HENT:     Head: Atraumatic.     Right Ear: Tympanic membrane and external ear normal.     Left Ear: Tympanic membrane and external ear normal.     Nose: Nose normal.     Mouth/Throat:     Mouth: Mucous membranes are moist.     Pharynx: No posterior oropharyngeal erythema.  Eyes:     Extraocular Movements: Extraocular movements intact.     Conjunctiva/sclera: Conjunctivae normal.  Cardiovascular:     Rate and Rhythm: Normal rate and regular rhythm.     Heart sounds: Normal heart sounds.  Pulmonary:     Effort: Pulmonary effort is normal.     Breath sounds: Normal breath sounds. No wheezing or rales.  Musculoskeletal:        General: Normal range of motion.     Cervical back: Normal range of motion and neck supple.  Skin:    General: Skin is warm and dry.  Neurological:     Mental Status: She is alert and oriented to person, place, and time.  Psychiatric:        Mood and Affect: Mood normal.        Thought Content: Thought content normal.      UC Treatments / Results  Labs (all labs ordered are listed, but only abnormal results are displayed) Labs Reviewed  POC COVID19/FLU A&B COMBO    EKG   Radiology No results found.  Procedures Procedures (including critical care time)  Medications Ordered in UC Medications  dexamethasone  (DECADRON ) injection 10 mg (has no administration in time range)    Initial Impression / Assessment and Plan / UC Course  I have reviewed the triage vital signs and the nursing notes.  Pertinent labs & imaging results that were available during my care of the patient were reviewed by me and  considered  in my medical decision making (see chart for details).     Rapid flu and COVID-negative, oxygen saturation 97% on room air and she is well-appearing and in no acute distress.  Suspect a viral bronchitis.  Treat with IM Decadron , continued over-the-counter cold and congestion remedies and supportive home care.  Return for worsening symptoms.  Final Clinical Impressions(s) / UC Diagnoses   Final diagnoses:  Viral bronchitis   Discharge Instructions   None    ED Prescriptions   None    PDMP not reviewed this encounter.   Stuart Vernell Norris, [place] 08/19/23 1511

## 2023-10-20 DIAGNOSIS — Z6821 Body mass index (BMI) 21.0-21.9, adult: Secondary | ICD-10-CM | POA: Diagnosis not present

## 2023-10-20 DIAGNOSIS — I10 Essential (primary) hypertension: Secondary | ICD-10-CM | POA: Diagnosis not present

## 2023-10-20 DIAGNOSIS — G9332 Myalgic encephalomyelitis/chronic fatigue syndrome: Secondary | ICD-10-CM | POA: Diagnosis not present

## 2023-10-20 DIAGNOSIS — J45909 Unspecified asthma, uncomplicated: Secondary | ICD-10-CM | POA: Diagnosis not present

## 2023-10-20 DIAGNOSIS — F419 Anxiety disorder, unspecified: Secondary | ICD-10-CM | POA: Diagnosis not present

## 2023-11-02 DIAGNOSIS — Z6822 Body mass index (BMI) 22.0-22.9, adult: Secondary | ICD-10-CM | POA: Diagnosis not present

## 2023-11-02 DIAGNOSIS — W57XXXA Bitten or stung by nonvenomous insect and other nonvenomous arthropods, initial encounter: Secondary | ICD-10-CM | POA: Diagnosis not present

## 2023-11-02 DIAGNOSIS — S50862A Insect bite (nonvenomous) of left forearm, initial encounter: Secondary | ICD-10-CM | POA: Diagnosis not present

## 2023-11-06 ENCOUNTER — Ambulatory Visit (INDEPENDENT_AMBULATORY_CARE_PROVIDER_SITE_OTHER): Payer: Medicare Other

## 2023-12-07 DIAGNOSIS — S50862A Insect bite (nonvenomous) of left forearm, initial encounter: Secondary | ICD-10-CM | POA: Diagnosis not present

## 2023-12-07 DIAGNOSIS — Z6822 Body mass index (BMI) 22.0-22.9, adult: Secondary | ICD-10-CM | POA: Diagnosis not present

## 2023-12-07 DIAGNOSIS — W57XXXA Bitten or stung by nonvenomous insect and other nonvenomous arthropods, initial encounter: Secondary | ICD-10-CM | POA: Diagnosis not present

## 2024-02-06 DIAGNOSIS — E785 Hyperlipidemia, unspecified: Secondary | ICD-10-CM | POA: Diagnosis not present

## 2024-02-06 DIAGNOSIS — J45909 Unspecified asthma, uncomplicated: Secondary | ICD-10-CM | POA: Diagnosis not present

## 2024-02-06 DIAGNOSIS — F419 Anxiety disorder, unspecified: Secondary | ICD-10-CM | POA: Diagnosis not present

## 2024-02-06 DIAGNOSIS — Z0001 Encounter for general adult medical examination with abnormal findings: Secondary | ICD-10-CM | POA: Diagnosis not present

## 2024-02-06 DIAGNOSIS — D518 Other vitamin B12 deficiency anemias: Secondary | ICD-10-CM | POA: Diagnosis not present

## 2024-02-06 DIAGNOSIS — Z1331 Encounter for screening for depression: Secondary | ICD-10-CM | POA: Diagnosis not present

## 2024-02-06 DIAGNOSIS — E559 Vitamin D deficiency, unspecified: Secondary | ICD-10-CM | POA: Diagnosis not present

## 2024-02-06 DIAGNOSIS — G9332 Myalgic encephalomyelitis/chronic fatigue syndrome: Secondary | ICD-10-CM | POA: Diagnosis not present

## 2024-02-06 DIAGNOSIS — Z6822 Body mass index (BMI) 22.0-22.9, adult: Secondary | ICD-10-CM | POA: Diagnosis not present

## 2024-02-06 DIAGNOSIS — I872 Venous insufficiency (chronic) (peripheral): Secondary | ICD-10-CM | POA: Diagnosis not present

## 2024-02-06 DIAGNOSIS — I10 Essential (primary) hypertension: Secondary | ICD-10-CM | POA: Diagnosis not present

## 2024-05-30 DIAGNOSIS — Z6821 Body mass index (BMI) 21.0-21.9, adult: Secondary | ICD-10-CM | POA: Diagnosis not present

## 2024-05-30 DIAGNOSIS — F419 Anxiety disorder, unspecified: Secondary | ICD-10-CM | POA: Diagnosis not present

## 2024-05-30 DIAGNOSIS — I10 Essential (primary) hypertension: Secondary | ICD-10-CM | POA: Diagnosis not present

## 2024-05-30 DIAGNOSIS — Z7689 Persons encountering health services in other specified circumstances: Secondary | ICD-10-CM | POA: Diagnosis not present

## 2024-05-30 DIAGNOSIS — Z87898 Personal history of other specified conditions: Secondary | ICD-10-CM | POA: Diagnosis not present

## 2024-06-20 DIAGNOSIS — B351 Tinea unguium: Secondary | ICD-10-CM | POA: Diagnosis not present

## 2024-06-20 DIAGNOSIS — M79674 Pain in right toe(s): Secondary | ICD-10-CM | POA: Diagnosis not present

## 2024-06-20 DIAGNOSIS — L84 Corns and callosities: Secondary | ICD-10-CM | POA: Diagnosis not present

## 2024-06-20 DIAGNOSIS — M79675 Pain in left toe(s): Secondary | ICD-10-CM | POA: Diagnosis not present

## 2024-06-21 DIAGNOSIS — K130 Diseases of lips: Secondary | ICD-10-CM | POA: Diagnosis not present

## 2024-08-20 ENCOUNTER — Ambulatory Visit: Admitting: Physician Assistant
